# Patient Record
Sex: Female | Born: 1970 | Race: White | Hispanic: No | Smoking: Current every day smoker
Health system: Southern US, Community
[De-identification: ages and names within clinical notes are randomized; demographics above are authoritative.]

## PROBLEM LIST (undated history)

## (undated) DIAGNOSIS — F329 Major depressive disorder, single episode, unspecified: Secondary | ICD-10-CM

## (undated) DIAGNOSIS — F419 Anxiety disorder, unspecified: Secondary | ICD-10-CM

## (undated) DIAGNOSIS — E079 Disorder of thyroid, unspecified: Secondary | ICD-10-CM

## (undated) DIAGNOSIS — M5030 Other cervical disc degeneration, unspecified cervical region: Secondary | ICD-10-CM

## (undated) DIAGNOSIS — A1801 Tuberculosis of spine: Secondary | ICD-10-CM

## (undated) DIAGNOSIS — I4891 Unspecified atrial fibrillation: Secondary | ICD-10-CM

## (undated) DIAGNOSIS — F32A Depression, unspecified: Secondary | ICD-10-CM

## (undated) HISTORY — DX: Disorder of thyroid, unspecified: E07.9

## (undated) HISTORY — DX: Major depressive disorder, single episode, unspecified: F32.9

## (undated) HISTORY — DX: Unspecified atrial fibrillation: I48.91

## (undated) HISTORY — PX: TONSILLECTOMY: SHX5217

## (undated) HISTORY — PX: KNEE SURGERY: SHX244

## (undated) HISTORY — DX: Depression, unspecified: F32.A

## (undated) HISTORY — DX: Tuberculosis of spine: A18.01

## (undated) HISTORY — DX: Other cervical disc degeneration, unspecified cervical region: M50.30

## (undated) HISTORY — PX: BREAST LUMPECTOMY: SHX2

## (undated) HISTORY — DX: Anxiety disorder, unspecified: F41.9

---

## 1998-03-24 ENCOUNTER — Other Ambulatory Visit: Admission: RE | Admit: 1998-03-24 | Discharge: 1998-03-24 | Payer: Self-pay | Admitting: Family Medicine

## 2000-06-28 ENCOUNTER — Encounter: Admission: RE | Admit: 2000-06-28 | Discharge: 2000-08-11 | Payer: Self-pay | Admitting: Orthopedic Surgery

## 2000-08-24 ENCOUNTER — Ambulatory Visit (HOSPITAL_BASED_OUTPATIENT_CLINIC_OR_DEPARTMENT_OTHER): Admission: RE | Admit: 2000-08-24 | Discharge: 2000-08-24 | Payer: Self-pay | Admitting: Orthopedic Surgery

## 2004-03-12 ENCOUNTER — Other Ambulatory Visit: Admission: RE | Admit: 2004-03-12 | Discharge: 2004-03-12 | Payer: Self-pay | Admitting: Family Medicine

## 2005-07-05 ENCOUNTER — Other Ambulatory Visit: Admission: RE | Admit: 2005-07-05 | Discharge: 2005-07-05 | Payer: Self-pay | Admitting: Family Medicine

## 2006-03-31 ENCOUNTER — Encounter: Admission: RE | Admit: 2006-03-31 | Discharge: 2006-03-31 | Payer: Self-pay | Admitting: Family Medicine

## 2007-05-30 ENCOUNTER — Encounter: Admission: RE | Admit: 2007-05-30 | Discharge: 2007-05-30 | Payer: Self-pay | Admitting: Family Medicine

## 2011-03-05 NOTE — Op Note (Signed)
[place]. [facility]  Patient:    GEORGETTA, CRAFTON                       MRN: [id number] Proc. Date: 08/24/00 Adm. Date:  [id number] Attending:  Alinda Deem                           Operative Report  PREOPERATIVE DIAGNOSIS:  Right femoral neck fracture.  POSTOPERATIVE DIAGNOSIS:  Right femoral neck fracture.  OPERATION PERFORMED:  Right hip hemiarthroplasty using Osteonics #7 ODC stem, 11 mm tip, #3 cement restrictor, 47 mm NK+0 monopolar ball.  A double batch of Simplex methyl methacrylate cement with 1500 mg of Zinacef.  SURGEON:  Alinda Deem, M.D.  ASSISTANT:  Dorthula Matas, P.A.-C.  ANESTHESIA:  General endotracheal.  ESTIMATED BLOOD LOSS:  400 cc.  FLUID REPLACEMENT:  1200 cc of crystalloid.  DRAINS:  None.  TOURNIQUET TIME:  None.  INDICATIONS FOR PROCEDURE:  The patient is a DD:  08/24/00 TD:  08/25/00 Job: [id number]

## 2011-03-05 NOTE — Op Note (Signed)
[name]. [facility]  Patient:    Michelle Ortega, BOOZ                       MRN: [id number] Proc. Date: 08/24/00 Adm. Date:  [id number] Attending:  Alinda Deem                           Operative Report  PREOPERATIVE DIAGNOSIS:  Left knee medial meniscal tear.  POSTOPERATIVE DIAGNOSIS:  Left knee plica syndrome.  OPERATION:  Left knee excision of inflamed plica.  SURGEON:  Alinda Deem, M.D.  FIRST ASSISTANT:  Dorthula Matas, P.A.-C.  ANESTHESIA:  General LMA  ESTIMATED BLOOD LOSS:  Minimal  FLUID REPLACEMENT:  100 cc of crystalloid.  DRAINS PLACED: None.  TOURNIQUET TIME:  None.  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  Because of persistent medial joint line pain, she is taken to the operating room for arthroscopic evaluation of her left knee with presumed diagnosis medial meniscal tear.  DESCRIPTION OF PROCEDURE:  The patient identified by arm band and taken to the operating room at [facility].  Appropriate anesthetic monitors were attached and general LMA anesthesia induced with the patient in the supine position.  At this point, the lateral post was applied to the table and all four extremities prepped and draped in the usual sterile fashion from the ankle to the mid thigh.  Standard inferomedial and inferolateral parapatellar portals were then made allowing introduction of the arthroscope through the inferolateral portal, outflow through the inferomedial portal.  The suprapatellar pouch, patella and trochlear were in excellent condition.  The lateral compartment was in excellent condition as was the medial compartment, ACL and PCL.  There was an inflamed plica noted and this  was resected with a 3.5 mm gator sucker shaver back to a stable margin.  The gutters were cleared. The scope was taken medial and lateral to the PCL clearing the posterior compartment as well.  At this point, the arthroscopic instruments were removed.  A dressing of Xeroform followed by a dressing sponge, Webril and Ace wrap applied.  The patient was awakened and taken to the recovery room without difficulty. DD:  08/24/00 TD:  08/25/00 Job: [id number]

## 2013-06-01 ENCOUNTER — Encounter: Payer: Self-pay | Admitting: Family Medicine

## 2013-06-01 ENCOUNTER — Ambulatory Visit (INDEPENDENT_AMBULATORY_CARE_PROVIDER_SITE_OTHER): Payer: 59 | Admitting: Family Medicine

## 2013-06-01 VITALS — BP 109/70 | HR 78 | Temp 97.1°F | Ht 66.0 in | Wt 134.0 lb

## 2013-06-01 DIAGNOSIS — R5383 Other fatigue: Secondary | ICD-10-CM

## 2013-06-01 DIAGNOSIS — R55 Syncope and collapse: Secondary | ICD-10-CM

## 2013-06-01 DIAGNOSIS — R5381 Other malaise: Secondary | ICD-10-CM

## 2013-06-01 DIAGNOSIS — H539 Unspecified visual disturbance: Secondary | ICD-10-CM

## 2013-06-01 DIAGNOSIS — R42 Dizziness and giddiness: Secondary | ICD-10-CM

## 2013-06-01 DIAGNOSIS — R51 Headache: Secondary | ICD-10-CM

## 2013-06-01 LAB — POCT UA - MICROSCOPIC ONLY
Bacteria, U Microscopic: NEGATIVE
Casts, Ur, LPF, POC: NEGATIVE
Crystals, Ur, HPF, POC: NEGATIVE
WBC, Ur, HPF, POC: NEGATIVE
Yeast, UA: NEGATIVE

## 2013-06-01 LAB — POCT CBC
Granulocyte percent: 71 %G (ref 37–80)
HCT, POC: 39.4 % (ref 37.7–47.9)
Hemoglobin: 13.6 g/dL (ref 12.2–16.2)
Lymph, poc: 2.3 (ref 0.6–3.4)
MCH, POC: 32.1 pg — AB (ref 27–31.2)
MCHC: 34.6 g/dL (ref 31.8–35.4)
MCV: 92.7 fL (ref 80–97)
MPV: 7.6 fL (ref 0–99.8)
POC Granulocyte: 6 (ref 2–6.9)
POC LYMPH PERCENT: 26.8 %L (ref 10–50)
Platelet Count, POC: 251 10*3/uL (ref 142–424)
RBC: 4.3 M/uL (ref 4.04–5.48)
RDW, POC: 12.5 %
WBC: 8.5 10*3/uL (ref 4.6–10.2)

## 2013-06-01 LAB — POCT URINALYSIS DIPSTICK
Bilirubin, UA: NEGATIVE
Blood, UA: NEGATIVE
Glucose, UA: NEGATIVE
Ketones, UA: NEGATIVE
Leukocytes, UA: NEGATIVE
Nitrite, UA: NEGATIVE
Protein, UA: NEGATIVE
Spec Grav, UA: 1.02
Urobilinogen, UA: NEGATIVE
pH, UA: 6.5

## 2013-06-01 MED ORDER — MECLIZINE HCL 25 MG PO TABS: 25.0000 mg | ORAL_TABLET | Freq: Three times a day (TID) | ORAL | Status: DC | PRN

## 2013-06-01 NOTE — Progress Notes (Signed)
Subjective:    Patient ID: Michelle Ortega, female    DOB: [birth year], 42 y.o.   MRN: [id number]  HPI This 42 y.o. female presents for with multiple medical complaints of dizzyness and feeling like she is going to pass out. She states over last 2 weeks she has been having pre-syncopal episodes.  She is having dizziness.  She is having headaches and blurred vision.  She has hx of migraine headaches.  She has been feeling tired and fatigued. Her last episode was 2 days ago and she went to get up at [facility] and she felt weak nauseated And then went to the bathroom and vomited. She has been having these symptoms on and off for 2 weeks. She has been having headaches and blurred vision.  She is teary in the interview.   Review of Systems    No chest pain, SOB, HA, dizziness, vision change, N/V, diarrhea, constipation, dysuria, urinary urgency or frequency, myalgias, arthralgias or rash.  Objective:   Physical Exam Vital signs noted  Well developed well nourished female.  HEENT - Head atraumatic Normocephalic                Eyes - PERRLA, Conjuctiva - clear Sclera- Clear EOMI                Ears - EAC's Wnl TM's Wnl Gross Hearing WNL                Nose - Nares patent                 Throat - oropharanx wnl Respiratory - Lungs CTA bilateral Cardiac - RRR S1 and S2 without murmur GI - Abdomen soft Nontender and bowel sounds active x 4 Extremities - No edema. Neuro - Grossly intact.   Results for orders placed in visit on 06/01/13  CMP14+EGFR      Result Value Range   Glucose 80  65 - 99 mg/dL   BUN 8  6 - 24 mg/dL   Creatinine, Ser 4.09 (*) 0.57 - 1.00 mg/dL   GFR calc non Af Amer 119  >59 mL/min/1.73   GFR calc Af Amer 137  >59 mL/min/1.73   BUN/Creatinine Ratio 15  9 - 23   Sodium 139  134 - 144 mmol/L   Potassium 4.2  3.5 - 5.2 mmol/L   Chloride 101  97 - 108 mmol/L   CO2 25  18 - 29 mmol/L   Calcium 9.3  8.7 - 10.2 mg/dL   Total Protein 7.0  6.0 - 8.5 g/dL   Albumin 4.4   3.5 - 5.5 g/dL   Globulin, Total 2.6  1.5 - 4.5 g/dL   Albumin/Globulin Ratio 1.7  1.1 - 2.5   Total Bilirubin 0.4  0.0 - 1.2 mg/dL   Alkaline Phosphatase 69  39 - 117 IU/L   AST 15  0 - 40 IU/L   ALT 7  0 - 32 IU/L  TSH      Result Value Range   TSH 8.470 (*) 0.450 - 4.500 uIU/mL   COMMENT Comment    POCT CBC      Result Value Range   WBC 8.5  4.6 - 10.2 K/uL   Lymph, poc 2.3  0.6 - 3.4   POC LYMPH PERCENT 26.8  10 - 50 %L   POC Granulocyte 6.0  2 - 6.9   Granulocyte percent 71.0  37 - 80 %G   RBC 4.3  4.04 - 5.48  M/uL   Hemoglobin 13.6  12.2 - 16.2 g/dL   HCT, POC 78.2  95.6 - 47.9 %   MCV 92.7  80 - 97 fL   MCH, POC 32.1 (*) 27 - 31.2 pg   MCHC 34.6  31.8 - 35.4 g/dL   RDW, POC 21.3     Platelet Count, POC 251.0  142 - 424 K/uL   MPV 7.6  0 - 99.8 fL  POCT UA - MICROSCOPIC ONLY      Result Value Range   WBC, Ur, HPF, POC neg     RBC, urine, microscopic 1-2     Bacteria, U Microscopic neg     Mucus, UA trace     Epithelial cells, urine per micros occ     Crystals, Ur, HPF, POC neg     Casts, Ur, LPF, POC neg     Yeast, UA neg    POCT URINALYSIS DIPSTICK      Result Value Range   Color, UA yellow     Clarity, UA clear     Glucose, UA neg     Bilirubin, UA neg     Ketones, UA neg     Spec Grav, UA 1.020     Blood, UA neg     pH, UA 6.5     Protein, UA neg     Urobilinogen, UA negative     Nitrite, UA neg     Leukocytes, UA Negative        Assessment & Plan:  Pre-syncope - Plan: Ambulatory referral to Cardiology, POCT UA - Microscopic Only, CANCELED: POCT UA - Microalbumin  Headache(784.0) - Plan: CT Head W Contrast  Visual disturbance - Follow up with optometrist  Vertigo - Plan: meclizine (ANTIVERT) 25 MG tablet, POCT urinalysis dipstick  Other malaise and fatigue - Plan: POCT CBC, CMP14+EGFR, TSH, POCT urinalysis dipstick  Follow up in one week.

## 2013-06-02 LAB — CMP14+EGFR
ALT: 7 IU/L (ref 0–32)
AST: 15 IU/L (ref 0–40)
Albumin/Globulin Ratio: 1.7 (ref 1.1–2.5)
Albumin: 4.4 g/dL (ref 3.5–5.5)
Alkaline Phosphatase: 69 IU/L (ref 39–117)
BUN/Creatinine Ratio: 15 (ref 9–23)
BUN: 8 mg/dL (ref 6–24)
CO2: 25 mmol/L (ref 18–29)
Calcium: 9.3 mg/dL (ref 8.7–10.2)
Chloride: 101 mmol/L (ref 97–108)
Creatinine, Ser: 0.52 mg/dL — ABNORMAL LOW (ref 0.57–1.00)
GFR calc Af Amer: 137 mL/min/{1.73_m2} (ref >59)
GFR calc non Af Amer: 119 mL/min/{1.73_m2} (ref >59)
Globulin, Total: 2.6 g/dL (ref 1.5–4.5)
Glucose: 80 mg/dL (ref 65–99)
Potassium: 4.2 mmol/L (ref 3.5–5.2)
Sodium: 139 mmol/L (ref 134–144)
Total Bilirubin: 0.4 mg/dL (ref 0.0–1.2)
Total Protein: 7 g/dL (ref 6.0–8.5)

## 2013-06-02 LAB — TSH: TSH: 8.47 u[IU]/mL — ABNORMAL HIGH (ref 0.450–4.500)

## 2013-06-04 ENCOUNTER — Other Ambulatory Visit: Payer: Self-pay | Admitting: Family Medicine

## 2013-06-04 DIAGNOSIS — E039 Hypothyroidism, unspecified: Secondary | ICD-10-CM

## 2013-06-04 MED ORDER — LEVOTHYROXINE SODIUM 25 MCG PO TABS: 25.0000 ug | ORAL_TABLET | Freq: Every day | ORAL | Status: DC

## 2013-06-04 NOTE — Patient Instructions (Signed)

## 2013-06-05 ENCOUNTER — Other Ambulatory Visit: Payer: Self-pay

## 2013-06-05 DIAGNOSIS — R51 Headache: Secondary | ICD-10-CM

## 2013-06-06 ENCOUNTER — Ambulatory Visit (INDEPENDENT_AMBULATORY_CARE_PROVIDER_SITE_OTHER): Payer: 59 | Admitting: Family Medicine

## 2013-06-06 ENCOUNTER — Encounter: Payer: Self-pay | Admitting: Family Medicine

## 2013-06-06 VITALS — BP 111/74 | HR 87 | Temp 98.1°F | Wt 135.6 lb

## 2013-06-06 DIAGNOSIS — E039 Hypothyroidism, unspecified: Secondary | ICD-10-CM

## 2013-06-06 DIAGNOSIS — R519 Headache, unspecified: Secondary | ICD-10-CM | POA: Insufficient documentation

## 2013-06-06 DIAGNOSIS — R55 Syncope and collapse: Secondary | ICD-10-CM | POA: Insufficient documentation

## 2013-06-06 DIAGNOSIS — R42 Dizziness and giddiness: Secondary | ICD-10-CM | POA: Insufficient documentation

## 2013-06-06 DIAGNOSIS — R51 Headache: Secondary | ICD-10-CM

## 2013-06-06 NOTE — Progress Notes (Signed)
  Subjective:    Patient ID: Michelle Ortega, female    DOB: [birth year], 42 y.o.   MRN: [id number]  HPI This 42 y.o. female presents for evaluation of dizziness. She has been having dizziness Which has improved since taking the antivert.  She states she is still awaiting her cardiology Referral for her pre-syncopal episodes.  She has not had any sx's of recent.  Her recent labs Show hypothyroid state and she has been rx'd levothyroxine which she has just started. She has been having severe headaches and a CT scan of the head has been ordered.   Review of Systems C/o headaches and dizziness. No chest pain, SOB, HA, dizziness, vision change, N/V, diarrhea, constipation, dysuria, urinary urgency or frequency, myalgias, arthralgias or rash.     Objective:   Physical Exam Vital signs noted  Well developed well nourished female.  HEENT - Head atraumatic Normocephalic                Eyes - PERRLA, Conjuctiva - clear Sclera- Clear EOMI                Ears - EAC's Wnl TM's Wnl Gross Hearing WNL                Nose - Nares patent                 Throat - oropharanx wnl Respiratory - Lungs CTA bilateral Cardiac - RRR S1 and S2 without murmur        Assessment & Plan:  Unspecified hypothyroidism - Continue levothyroxine po qd and follow up for TSH in one month.  Headache(784.0) - Await CT of the head  Pre-syncope - Follow up with Cardiology  Vertigo - Improving and continue meclizine

## 2013-06-06 NOTE — Patient Instructions (Signed)

## 2013-06-08 ENCOUNTER — Ambulatory Visit (HOSPITAL_COMMUNITY): Payer: 59

## 2013-06-08 ENCOUNTER — Telehealth: Payer: Self-pay | Admitting: Family Medicine

## 2013-06-11 ENCOUNTER — Other Ambulatory Visit: Payer: Self-pay | Admitting: Family Medicine

## 2013-06-11 ENCOUNTER — Ambulatory Visit (HOSPITAL_COMMUNITY)
Admission: RE | Admit: 2013-06-11 | Discharge: 2013-06-11 | Disposition: A | Discharge status: Home / Self Care | Payer: 59 | Source: Ambulatory Visit | Attending: Family Medicine | Admitting: Family Medicine

## 2013-06-11 DIAGNOSIS — R51 Headache: Secondary | ICD-10-CM

## 2013-06-12 ENCOUNTER — Telehealth: Payer: Self-pay | Admitting: Family Medicine

## 2013-07-09 ENCOUNTER — Other Ambulatory Visit: Payer: 59

## 2014-12-20 ENCOUNTER — Other Ambulatory Visit: Payer: Self-pay | Admitting: Physician Assistant

## 2014-12-20 ENCOUNTER — Encounter: Payer: Self-pay | Admitting: Physician Assistant

## 2014-12-20 ENCOUNTER — Telehealth: Payer: Self-pay | Admitting: Physician Assistant

## 2014-12-20 ENCOUNTER — Ambulatory Visit (INDEPENDENT_AMBULATORY_CARE_PROVIDER_SITE_OTHER): Payer: 59

## 2014-12-20 ENCOUNTER — Ambulatory Visit (INDEPENDENT_AMBULATORY_CARE_PROVIDER_SITE_OTHER): Payer: 59 | Admitting: Physician Assistant

## 2014-12-20 VITALS — BP 110/76 | HR 86 | Temp 97.5°F | Ht 66.0 in | Wt 139.2 lb

## 2014-12-20 DIAGNOSIS — M79641 Pain in right hand: Secondary | ICD-10-CM

## 2014-12-20 DIAGNOSIS — M654 Radial styloid tenosynovitis [de Quervain]: Secondary | ICD-10-CM | POA: Diagnosis not present

## 2014-12-20 MED ORDER — MELOXICAM 7.5 MG PO TABS: 7.5000 mg | ORAL_TABLET | Freq: Every day | ORAL | Status: DC

## 2014-12-20 NOTE — Patient Instructions (Signed)
De Quervain's Tenosynovitis De Quervain's tenosynovitis involves inflammation of one or two tendon linings (sheaths) or strain of one or two tendons to the thumb: extensor pollicis brevis (EPB), or abductor pollicis longus (APL). This causes pain on the side of the wrist and base of the thumb. Tendon sheaths secrete a fluid that lubricates the tendon, allowing the tendon to move smoothly. When the sheath becomes inflamed, the tendon cannot move freely in the sheath. Both the EPB and APL tendons are important for proper use of the hand. The EPB tendon is important for straightening the thumb. The APL tendon is important for moving the thumb away from the index finger (abducting). The two tendons pass through a small tube (canal) in the wrist, near the base of the thumb. When the tendons become inflamed, pain is usually felt in this area. SYMPTOMS   Pain, tenderness, swelling, warmth, or redness over the base of the thumb and thumb side of the wrist.  Pain that gets worse when straightening the thumb.  Pain that gets worse when moving the thumb away from the index finger, against resistance.  Pain with pinching or gripping.  Locking or catching of the thumb.  Limited motion of the thumb.  Crackling sound (crepitation) when the tendon or thumb is moved or touched.  Fluid-filled cyst in the area of the base of the thumb. CAUSES   Tenosynovitis is often linked with overuse of the wrist.  Tenosynovitis may be caused by repeated injury to the thumb muscle and tendon units, and with repeated motions of the hand and wrist, due to friction of the tendon within the lining (sheath).  Tenosynovitis may also be due to a sudden increase in activity or change in activity. RISK INCREASES WITH:  Sports that involve repeated hand and wrist motions (golf, bowling, tennis, squash, racquetball).  Heavy labor.  Poor physical wrist strength and flexibility.  Failure to warm up properly before practice or  play.  Female gender.  New mothers who hold their baby's head for long periods or lift infants with thumbs in the infant's armpit (axilla). PREVENTION  Warm up and stretch properly before practice or competition.  Allow enough time for rest and recovery between practices and competition.  Maintain appropriate conditioning:  Cardiovascular fitness.  Forearm, wrist, and hand flexibility.  Muscle strength and endurance.  Use proper exercise technique. PROGNOSIS  This condition is usually curable within 6 weeks, if treated properly with non-surgical treatment and resting of the affected area.  RELATED COMPLICATIONS   Longer healing time if not properly treated or if not given enough time to heal.  Chronic inflammation, causing recurring symptoms of tenosynovitis. Permanent pain or restriction of movement.  Risks of surgery: infection, bleeding, injury to nerves (numbness of the thumb), continued pain, incomplete release of the tendon sheath, recurring symptoms, cutting of the tendons, tendons sliding out of position, weakness of the thumb, thumb stiffness. TREATMENT  First, treatment involves the use of medicine and ice, to reduce pain and inflammation. Patients are encouraged to stop or modify activities that aggravate the injury. Stretching and strengthening exercises may be advised. Exercises may be completed at home or with a therapist. You may be fitted with a brace or splint, to limit motion and allow the injury to heal. Your caregiver may also choose to give you a corticosteroid injection, to reduce the pain and inflammation. If non-surgical treatment is not successful, surgery may be needed. Most tenosynovitis surgeries are done as outpatient procedures (you go home the   same day). Surgery may involve local, regional (whole arm), or general anesthesia.  MEDICATION   If pain medicine is needed, nonsteroidal anti-inflammatory medicines (aspirin and ibuprofen), or other minor pain  relievers (acetaminophen), are often advised.  Do not take pain medicine for 7 days before surgery.  Prescription pain relievers are often prescribed only after surgery. Use only as directed and only as much as you need.  Corticosteroid injections may be given if your caregiver thinks they are needed. There is a limited number of times these injections may be given. COLD THERAPY   Cold treatment (icing) should be applied for 10 to 15 minutes every 2 to 3 hours for inflammation and pain, and immediately after activity that aggravates your symptoms. Use ice packs or an ice massage. SEEK MEDICAL CARE IF:   Symptoms get worse or do not improve in 2 to 4 weeks, despite treatment.  You experience pain, numbness, or coldness in the hand.  Blue, gray, or dark color appears in the fingernails.  Any of the following occur after surgery: increased pain, swelling, redness, drainage of fluids, bleeding in the affected area, or signs of infection.  New, unexplained symptoms develop. (Drugs used in treatment may produce side effects.) Document Released: 10/04/2005 Document Revised: 12/27/2011 Document Reviewed: 01/16/2009 ExitCare Patient Information 2015 [employer]. This information is not intended to replace advice given to you by your health care provider. Make sure you discuss any questions you have with your health care provider.   

## 2014-12-23 NOTE — Progress Notes (Signed)
Subjective:     Patient ID: Michelle Ortega, female   DOB: 05/14/1971, 44 y.o.   MRN: [id number]  HPI 44 y/o RH dominant female presents with intermittent right wrist pain x 1 month. She works at [employer] and frequently [employer] boxes and performs repetitive motions. Has tried aleve and goodys with no relief. No known injury   Review of Systems  Musculoskeletal: Positive for arthralgias (right wrist/thumb. Worse with movement). Negative for joint swelling.  Skin: Negative.        Objective:   Physical Exam  Constitutional: She is oriented to person, place, and time.  Musculoskeletal: She exhibits no edema.  Pain worse with resisted supination and forward wrist flexion  Neurological: She is alert and oriented to person, place, and time.       Assessment:     Berline Lopese Quervains Tenosynovitis - xray negative for fracture or dislocation     Plan:     Rx Mobic 7.5 mg q day. May increase to 2 tablets daily on week 2 if no improvement. RICE and wrist brace 24 hrs/day . No lifting F/U in 3-4 weeks for reassessment.

## 2015-01-20 ENCOUNTER — Encounter: Payer: Self-pay | Admitting: Family

## 2015-01-20 ENCOUNTER — Ambulatory Visit (INDEPENDENT_AMBULATORY_CARE_PROVIDER_SITE_OTHER): Payer: 59 | Admitting: Family

## 2015-01-20 ENCOUNTER — Other Ambulatory Visit: Payer: Self-pay | Admitting: Family

## 2015-01-20 VITALS — BP 109/79 | HR 102 | Temp 98.8°F | Ht 66.0 in | Wt 139.8 lb

## 2015-01-20 DIAGNOSIS — Z0000 Encounter for general adult medical examination without abnormal findings: Secondary | ICD-10-CM | POA: Diagnosis not present

## 2015-01-20 DIAGNOSIS — E039 Hypothyroidism, unspecified: Secondary | ICD-10-CM | POA: Diagnosis not present

## 2015-01-20 DIAGNOSIS — Z01419 Encounter for gynecological examination (general) (routine) without abnormal findings: Secondary | ICD-10-CM | POA: Diagnosis not present

## 2015-01-20 DIAGNOSIS — F329 Major depressive disorder, single episode, unspecified: Secondary | ICD-10-CM

## 2015-01-20 DIAGNOSIS — S63501D Unspecified sprain of right wrist, subsequent encounter: Secondary | ICD-10-CM

## 2015-01-20 DIAGNOSIS — F32A Depression, unspecified: Secondary | ICD-10-CM

## 2015-01-20 MED ORDER — CITALOPRAM HYDROBROMIDE 20 MG PO TABS: 20.0000 mg | ORAL_TABLET | Freq: Every day | ORAL | Status: DC

## 2015-01-20 MED ORDER — MELOXICAM 15 MG PO TABS: 15.0000 mg | ORAL_TABLET | Freq: Every day | ORAL | Status: DC

## 2015-01-20 MED ORDER — LEVOTHYROXINE SODIUM 25 MCG PO TABS: 25.0000 ug | ORAL_TABLET | Freq: Every day | ORAL | Status: DC

## 2015-01-20 NOTE — Progress Notes (Signed)
Subjective:    Patient ID: Michelle Ortega, female    DOB: 09/24/1971, 44 y.o.   MRN: [id number]  Pt presents to the office today for CPE with pap.  Gynecologic Exam Pertinent negatives include no diarrhea, headaches or joint swelling.  Wrist Pain  The pain is present in the right wrist. This is a new problem. The current episode started more than 1 month ago. There has been no history of extremity trauma. The problem occurs intermittently. The problem has been gradually improving. The pain is at a severity of 3/10. The pain is mild. Pertinent negatives include no inability to bear weight, itching, joint swelling, numbness, stiffness or tingling. She has tried movement and NSAIDS for the symptoms. The treatment provided significant relief. There is no history of diabetes.  Thyroid Problem Presents for follow-up visit. Symptoms include depressed mood and hair loss. Patient reports no anxiety, diaphoresis, diarrhea, dry skin, palpitations or visual change. The symptoms have been worsening. Past treatments include levothyroxine. The treatment provided significant relief. There is no history of diabetes.      Review of Systems  Constitutional: Negative.  Negative for diaphoresis.  HENT: Negative.   Eyes: Negative.   Respiratory: Negative.  Negative for shortness of breath.   Cardiovascular: Negative.  Negative for palpitations.  Gastrointestinal: Negative.  Negative for diarrhea.  Endocrine: Negative.   Genitourinary: Negative.   Musculoskeletal: Negative.  Negative for stiffness.  Skin: Negative for itching.  Neurological: Negative.  Negative for tingling, numbness and headaches.  Hematological: Negative.   Psychiatric/Behavioral: Negative.  The patient is not nervous/anxious.   All other systems reviewed and are negative.      Objective:   Physical Exam  Constitutional: She is oriented to person, place, and time. She appears well-developed and well-nourished. No distress.  HENT:    Head: Normocephalic and atraumatic.  Right Ear: External ear normal.  Left Ear: External ear normal.  Nose: Nose normal.  Mouth/Throat: Oropharynx is clear and moist.  Eyes: Pupils are equal, round, and reactive to light.  Neck: Normal range of motion. Neck supple. No thyromegaly present.  Cardiovascular: Normal rate, regular rhythm, normal heart sounds and intact distal pulses.   No murmur heard. Pulmonary/Chest: Effort normal and breath sounds normal. No respiratory distress. She has no wheezes. Right breast exhibits no inverted nipple, no mass, no nipple discharge, no skin change and no tenderness. Left breast exhibits no inverted nipple, no mass, no nipple discharge, no skin change and no tenderness. Breasts are symmetrical.  Abdominal: Soft. Bowel sounds are normal. She exhibits no distension. There is no tenderness.  Genitourinary: Vagina normal.  Bimanual exam- no adnexal masses or tenderness, ovaries nonpalpable   Cervix parous and pink- No discharge   Musculoskeletal: Normal range of motion. She exhibits no edema or tenderness.  Neurological: She is alert and oriented to person, place, and time. She has normal reflexes. No cranial nerve deficit.  Skin: Skin is warm and dry.  Psychiatric: She has a normal mood and affect. Her behavior is normal. Judgment and thought content normal.  Vitals reviewed.   BP 109/79 mmHg  Pulse 102  Temp(Src) 98.8 F (37.1 C) (Oral)  Ht _0  (1.676 m)  Wt 139 lb 12.8 oz (63.413 kg)  BMI 22.58 kg/m2       Assessment & Plan:  1. Hypothyroidism, unspecified hypothyroidism type -Pt has been out of medication for a "few weeks" and has not taken medicaiton  Thyroid Panel With TSH - levothyroxine (LEVOTHROID) 25  MCG tablet; Take 1 tablet (25 mcg total) by mouth daily before breakfast.  Dispense: 30 tablet; Refill: 3  2. Annual physical exam - CMP14+EGFR - Lipid panel - Thyroid Panel With TSH - Vit D  25 hydroxy (rtn osteoporosis  monitoring) - POCT CBC - Pap IG w/ reflex to HPV when ASC-U  3. Encounter for routine gynecological examination - Pap IG w/ reflex to HPV when ASC-U  4. Wrist sprain, right, subsequent encounter -Rest -No other NSAID's  meloxicam (MOBIC) 15 MG tablet; Take 1 tablet (15 mg total) by mouth daily.  Dispense: 30 tablet; Refill: 0  5. Depression -Pt started on celexa today  citalopram (CELEXA) 20 MG tablet; Take 1 tablet (20 mg total) by mouth daily.  Dispense: 90 tablet; Refill: 3   Continue all meds Labs pending Health Maintenance reviewed-Currently out of TDAP- Pt will get TDAP in 2 weeks on recheck Diet and exercise encouraged RTO 2 weeks to recheck depression  Evelina Dun, FNP

## 2015-01-20 NOTE — Addendum Note (Signed)
Addended by: Prescott GumLAND, Zay Yeargan M on: 01/20/2015 04:18 PM   Modules accepted: Orders

## 2015-01-20 NOTE — Patient Instructions (Signed)

## 2015-01-20 NOTE — Addendum Note (Signed)
Addended by: Prescott GumLAND, Jerrion Tabbert M on: 01/20/2015 04:20 PM   Modules accepted: Kipp BroodSmartSet

## 2015-01-21 LAB — CBC WITH DIFFERENTIAL/PLATELET
BASOS ABS: 0 10*3/uL (ref 0.0–0.2)
BASOS: 0 %
EOS ABS: 0.1 10*3/uL (ref 0.0–0.4)
EOS: 1 %
HCT: 42 % (ref 34.0–46.6)
HEMOGLOBIN: 14.4 g/dL (ref 11.1–15.9)
Immature Grans (Abs): 0 10*3/uL (ref 0.0–0.1)
Immature Granulocytes: 0 %
LYMPHS: 29 %
Lymphocytes Absolute: 2.8 10*3/uL (ref 0.7–3.1)
MCH: 31.6 pg (ref 26.6–33.0)
MCHC: 34.3 g/dL (ref 31.5–35.7)
MCV: 92 fL (ref 79–97)
MONOCYTES: 8 %
Monocytes Absolute: 0.8 10*3/uL (ref 0.1–0.9)
NEUTROS PCT: 62 %
Neutrophils Absolute: 6.1 10*3/uL (ref 1.4–7.0)
Platelets: 278 10*3/uL (ref 150–379)
RBC: 4.55 x10E6/uL (ref 3.77–5.28)
RDW: 13.2 % (ref 12.3–15.4)
WBC: 9.8 10*3/uL (ref 3.4–10.8)

## 2015-01-21 LAB — VITAMIN D 25 HYDROXY (VIT D DEFICIENCY, FRACTURES): Vit D, 25-Hydroxy: 8.8 ng/mL — ABNORMAL LOW (ref 30.0–100.0)

## 2015-01-21 LAB — CMP14+EGFR
A/G RATIO: 1.7 (ref 1.1–2.5)
ALT: 8 IU/L (ref 0–32)
AST: 11 IU/L (ref 0–40)
Albumin: 4.5 g/dL (ref 3.5–5.5)
Alkaline Phosphatase: 72 IU/L (ref 39–117)
BUN/Creatinine Ratio: 14 (ref 9–23)
BUN: 8 mg/dL (ref 6–24)
Bilirubin Total: 0.3 mg/dL (ref 0.0–1.2)
CALCIUM: 8.9 mg/dL (ref 8.7–10.2)
CHLORIDE: 102 mmol/L (ref 97–108)
CO2: 20 mmol/L (ref 18–29)
Creatinine, Ser: 0.57 mg/dL (ref 0.57–1.00)
GFR calc Af Amer: 131 mL/min/{1.73_m2} (ref >59)
GFR, EST NON AFRICAN AMERICAN: 114 mL/min/{1.73_m2} (ref >59)
GLUCOSE: 90 mg/dL (ref 65–99)
Globulin, Total: 2.7 g/dL (ref 1.5–4.5)
POTASSIUM: 3.8 mmol/L (ref 3.5–5.2)
Sodium: 136 mmol/L (ref 134–144)
TOTAL PROTEIN: 7.2 g/dL (ref 6.0–8.5)

## 2015-01-21 LAB — PAP IG W/ RFLX HPV ASCU: PAP Smear Comment: 0

## 2015-01-21 LAB — THYROID PANEL WITH TSH
FREE THYROXINE INDEX: 1.5 (ref 1.2–4.9)
T3 Uptake Ratio: 23 % — ABNORMAL LOW (ref 24–39)
T4 TOTAL: 6.6 ug/dL (ref 4.5–12.0)
TSH: 10.29 u[IU]/mL — ABNORMAL HIGH (ref 0.450–4.500)

## 2015-01-21 LAB — LIPID PANEL
CHOL/HDL RATIO: 2.6 ratio (ref 0.0–4.4)
CHOLESTEROL TOTAL: 183 mg/dL (ref 100–199)
HDL: 70 mg/dL (ref >39)
LDL Calculated: 101 mg/dL — ABNORMAL HIGH (ref 0–99)
TRIGLYCERIDES: 60 mg/dL (ref 0–149)
VLDL Cholesterol Cal: 12 mg/dL (ref 5–40)

## 2015-01-22 ENCOUNTER — Other Ambulatory Visit: Payer: Self-pay | Admitting: Family

## 2015-01-22 DIAGNOSIS — E559 Vitamin D deficiency, unspecified: Secondary | ICD-10-CM

## 2015-01-22 MED ORDER — VITAMIN D (ERGOCALCIFEROL) 1.25 MG (50000 UNIT) PO CAPS
50000.0000 [IU] | ORAL_CAPSULE | ORAL | Status: DC
Start: 2015-01-22 — End: 2016-09-27

## 2015-01-22 MED ORDER — LEVOTHYROXINE SODIUM 75 MCG PO TABS: 75.0000 ug | ORAL_TABLET | Freq: Every day | ORAL | Status: DC

## 2015-01-23 ENCOUNTER — Encounter: Payer: Self-pay | Admitting: *Deleted

## 2015-02-07 ENCOUNTER — Ambulatory Visit (INDEPENDENT_AMBULATORY_CARE_PROVIDER_SITE_OTHER): Payer: 59 | Admitting: Family

## 2015-02-07 ENCOUNTER — Encounter: Payer: Self-pay | Admitting: Family

## 2015-02-07 VITALS — BP 119/79 | HR 82 | Temp 98.0°F | Ht 66.0 in | Wt 140.6 lb

## 2015-02-07 DIAGNOSIS — F32A Depression, unspecified: Secondary | ICD-10-CM

## 2015-02-07 DIAGNOSIS — F329 Major depressive disorder, single episode, unspecified: Secondary | ICD-10-CM | POA: Diagnosis not present

## 2015-02-07 DIAGNOSIS — Z23 Encounter for immunization: Secondary | ICD-10-CM

## 2015-02-07 DIAGNOSIS — F411 Generalized anxiety disorder: Secondary | ICD-10-CM | POA: Diagnosis not present

## 2015-02-07 NOTE — Progress Notes (Signed)
   Subjective:    Patient ID: Michelle Ortega, female    DOB: 09/29/1971, 44 y.o.   MRN: [id number]  Pt presents to the office to recheck depression and GAD. Pt was started on Celexa 20 mg daily. Pt states this is helping greatly and is happy with everything at this time.  Anxiety Presents for follow-up visit. Symptoms include nervous/anxious behavior. Patient reports no confusion, depressed mood, excessive worry, palpitations, panic or shortness of breath. Symptoms occur occasionally. The symptoms are aggravated by family issues. The quality of sleep is good.   Her past medical history is significant for anxiety/panic attacks and depression. Past treatments include SSRIs. The treatment provided moderate relief. Compliance with prior treatments has been good.      Review of Systems  Constitutional: Negative.   HENT: Negative.   Eyes: Negative.   Respiratory: Negative.  Negative for shortness of breath.   Cardiovascular: Negative.  Negative for palpitations.  Gastrointestinal: Negative.   Endocrine: Negative.   Genitourinary: Negative.   Musculoskeletal: Negative.   Neurological: Negative.  Negative for headaches.  Hematological: Negative.   Psychiatric/Behavioral: Negative for confusion. The patient is nervous/anxious.   All other systems reviewed and are negative.      Objective:   Physical Exam  Constitutional: She is oriented to person, place, and time. She appears well-developed and well-nourished. No distress.  HENT:  Head: Normocephalic and atraumatic.  Right Ear: External ear normal.  Left Ear: External ear normal.  Nose: Nose normal.  Mouth/Throat: Oropharynx is clear and moist.  Eyes: Pupils are equal, round, and reactive to light.  Neck: Normal range of motion. Neck supple. No thyromegaly present.  Cardiovascular: Normal rate, regular rhythm, normal heart sounds and intact distal pulses.   No murmur heard. Pulmonary/Chest: Effort normal and breath sounds normal. No  respiratory distress. She has no wheezes.  Abdominal: Soft. Bowel sounds are normal. She exhibits no distension. There is no tenderness.  Musculoskeletal: Normal range of motion. She exhibits no edema or tenderness.  Neurological: She is alert and oriented to person, place, and time. She has normal reflexes. No cranial nerve deficit.  Skin: Skin is warm and dry.  Psychiatric: She has a normal mood and affect. Her behavior is normal. Judgment and thought content normal.  Vitals reviewed.     BP 119/79 mmHg  Pulse 82  Temp(Src) 98 F (36.7 C) (Oral)  Ht 5\' 6"  (1.676 m)  Wt 140 lb 9.6 oz (63.776 kg)  BMI 22.70 kg/m2     Assessment & Plan:  1. Depression  2. GAD (generalized anxiety disorder)  Stress management discussed Continue all meds Health Maintenance reviewed-TDAP given today Diet and exercise encouraged RTO 1 year  Jannifer Rodneyhristy Micheal Murad, FNP

## 2015-02-07 NOTE — Patient Instructions (Signed)
Generalized Anxiety Disorder Generalized anxiety disorder (GAD) is a mental disorder. It interferes with life functions, including relationships, work, and school. GAD is different from normal anxiety, which everyone experiences at some point in their lives in response to specific life events and activities. Normal anxiety actually helps us prepare for and get through these life events and activities. Normal anxiety goes away after the event or activity is over.  GAD causes anxiety that is not necessarily related to specific events or activities. It also causes excess anxiety in proportion to specific events or activities. The anxiety associated with GAD is also difficult to control. GAD can vary from mild to severe. People with severe GAD can have intense waves of anxiety with physical symptoms (panic attacks).  SYMPTOMS The anxiety and worry associated with GAD are difficult to control. This anxiety and worry are related to many life events and activities and also occur more days than not for 6 months or longer. People with GAD also have three or more of the following symptoms (one or more in children):  Restlessness.   Fatigue.  Difficulty concentrating.   Irritability.  Muscle tension.  Difficulty sleeping or unsatisfying sleep. DIAGNOSIS GAD is diagnosed through an assessment by your health care provider. Your health care provider will ask you questions aboutyour mood,physical symptoms, and events in your life. Your health care provider may ask you about your medical history and use of alcohol or drugs, including prescription medicines. Your health care provider may also do a physical exam and blood tests. Certain medical conditions and the use of certain substances can cause symptoms similar to those associated with GAD. Your health care provider may refer you to a mental health specialist for further evaluation. TREATMENT The following therapies are usually used to treat GAD:    Medication. Antidepressant medication usually is prescribed for long-term daily control. Antianxiety medicines may be added in severe cases, especially when panic attacks occur.   Talk therapy (psychotherapy). Certain types of talk therapy can be helpful in treating GAD by providing support, education, and guidance. A form of talk therapy called cognitive behavioral therapy can teach you healthy ways to think about and react to daily life events and activities.  Stress managementtechniques. These include yoga, meditation, and exercise and can be very helpful when they are practiced regularly. A mental health specialist can help determine which treatment is best for you. Some people see improvement with one therapy. However, other people require a combination of therapies. Document Released: 01/29/2013 Document Revised: 02/18/2014 Document Reviewed: 01/29/2013 ExitCare Patient Information 2015 [employer]. This information is not intended to replace advice given to you by your health care provider. Make sure you discuss any questions you have with your health care provider. Depression Depression refers to feeling sad, low, down in the dumps, blue, gloomy, or empty. In general, there are two kinds of depression:  Normal sadness or normal grief. This kind of depression is one that we all feel from time to time after upsetting life experiences, such as the loss of a job or the ending of a relationship. This kind of depression is considered normal, is short lived, and resolves within a few days to 2 weeks. Depression experienced after the loss of a loved one (bereavement) often lasts longer than 2 weeks but normally gets better with time.  Clinical depression. This kind of depression lasts longer than normal sadness or normal grief or interferes with your ability to function at home, at work, and in school.   It also interferes with your personal relationships. It affects almost every aspect of your  life. Clinical depression is an illness. Symptoms of depression can also be caused by conditions other than those mentioned above, such as:  Physical illness. Some physical illnesses, including underactive thyroid gland (hypothyroidism), severe anemia, specific types of cancer, diabetes, uncontrolled seizures, heart and lung problems, strokes, and chronic pain are commonly associated with symptoms of depression.  Side effects of some prescription medicine. In some people, certain types of medicine can cause symptoms of depression.  Substance abuse. Abuse of alcohol and illicit drugs can cause symptoms of depression. SYMPTOMS Symptoms of normal sadness and normal grief include the following:  Feeling sad or crying for short periods of time.  Not caring about anything (apathy).  Difficulty sleeping or sleeping too much.  No longer able to enjoy the things you used to enjoy.  Desire to be by oneself all the time (social isolation).  Lack of energy or motivation.  Difficulty concentrating or remembering.  Change in appetite or weight.  Restlessness or agitation. Symptoms of clinical depression include the same symptoms of normal sadness or normal grief and also the following symptoms:  Feeling sad or crying all the time.  Feelings of guilt or worthlessness.  Feelings of hopelessness or helplessness.  Thoughts of suicide or the desire to harm yourself (suicidal ideation).  Loss of touch with reality (psychotic symptoms). Seeing or hearing things that are not real (hallucinations) or having false beliefs about your life or the people around you (delusions and paranoia). DIAGNOSIS  The diagnosis of clinical depression is usually based on how bad the symptoms are and how long they have lasted. Your health care provider will also ask you questions about your medical history and substance use to find out if physical illness, use of prescription medicine, or substance abuse is causing  your depression. Your health care provider may also order blood tests. TREATMENT  Often, normal sadness and normal grief do not require treatment. However, sometimes antidepressant medicine is given for bereavement to ease the depressive symptoms until they resolve. The treatment for clinical depression depends on how bad the symptoms are but often includes antidepressant medicine, counseling with a mental health professional, or both. Your health care provider will help to determine what treatment is best for you. Depression caused by physical illness usually goes away with appropriate medical treatment of the illness. If prescription medicine is causing depression, talk with your health care provider about stopping the medicine, decreasing the dose, or changing to another medicine. Depression caused by the abuse of alcohol or illicit drugs goes away when you stop using these substances. Some adults need professional help in order to stop drinking or using drugs. SEEK IMMEDIATE MEDICAL CARE IF:  You have thoughts about hurting yourself or others.  You lose touch with reality (have psychotic symptoms).  You are taking medicine for depression and have a serious side effect. FOR MORE INFORMATION  National Alliance on Mental Illness: www.nami.org  National Institute of Mental Health: www.nimh.nih.gov Document Released: 10/01/2000 Document Revised: 02/18/2014 Document Reviewed: 01/03/2012 ExitCare Patient Information 2015 [employer]. This information is not intended to replace advice given to you by your health care provider. Make sure you discuss any questions you have with your health care provider.  

## 2015-02-17 ENCOUNTER — Other Ambulatory Visit: Payer: Self-pay | Admitting: Family

## 2015-02-18 ENCOUNTER — Other Ambulatory Visit: Payer: Self-pay | Admitting: Family

## 2015-08-13 ENCOUNTER — Encounter: Payer: Self-pay | Admitting: Family Medicine

## 2015-08-13 ENCOUNTER — Ambulatory Visit (INDEPENDENT_AMBULATORY_CARE_PROVIDER_SITE_OTHER): Payer: 59 | Admitting: Family Medicine

## 2015-08-13 VITALS — BP 121/79 | HR 91 | Temp 97.6°F | Ht 66.0 in | Wt 143.4 lb

## 2015-08-13 DIAGNOSIS — J209 Acute bronchitis, unspecified: Secondary | ICD-10-CM | POA: Diagnosis not present

## 2015-08-13 MED ORDER — AZITHROMYCIN 250 MG PO TABS: ORAL_TABLET | ORAL | Status: DC

## 2015-08-13 MED ORDER — HYDROCODONE-HOMATROPINE 5-1.5 MG/5ML PO SYRP: 5.0000 mL | ORAL_SOLUTION | Freq: Three times a day (TID) | ORAL | Status: DC | PRN

## 2015-08-13 NOTE — Progress Notes (Signed)
   Subjective:    Patient ID: Michelle DodgeDiana W Fix, female    DOB: 10/03/1971, 44 y.o.   MRN: [id number]  HPI  [age] year old female with with cough and congestion. She is a smoker and she has a history of bronchitis and she says that what she is coughing up is yellow and tastes like infection. There is also some sinus congestion and drainage. She has heard some wheezes but no real shortness of breath.  Patient Active Problem List   Diagnosis Date Noted  . GAD (generalized anxiety disorder) 02/07/2015  . Vitamin D deficiency 01/22/2015  . Depression 01/20/2015  . Pre-syncope 06/06/2013  . Vertigo 06/06/2013  . Headache(784.0) 06/06/2013  . Hypothyroidism 06/06/2013   Outpatient Encounter Prescriptions as of 08/13/2015  Medication Sig  . citalopram (CELEXA) 20 MG tablet Take 1 tablet (20 mg total) by mouth daily.  Marland Kitchen. levothyroxine (SYNTHROID, LEVOTHROID) 75 MCG tablet Take 1 tablet (75 mcg total) by mouth daily.  . meloxicam (MOBIC) 15 MG tablet TAKE 1 TABLET(15 MG) BY MOUTH DAILY  . Vitamin D, Ergocalciferol, (DRISDOL) 50000 UNITS CAPS capsule Take 1 capsule (50,000 Units total) by mouth every 7 (seven) days.   No facility-administered encounter medications on file as of 08/13/2015.      Review of Systems  Constitutional: Negative.   HENT: Positive for congestion and postnasal drip.   Respiratory: Positive for wheezing.   Cardiovascular: Negative.   Genitourinary: Positive for hematuria.  Neurological: Negative.        Objective:   Physical Exam  Constitutional: She is oriented to person, place, and time. She appears well-developed and well-nourished.  HENT:  Head: Normocephalic.  Mouth/Throat: Oropharynx is clear and moist.  Cardiovascular: Normal rate and regular rhythm.   Pulmonary/Chest: Effort normal. She has wheezes.  Neurological: She is alert and oriented to person, place, and time.  Psychiatric: She has a normal mood and affect.          Assessment & Plan:  1. Acute  bronchitis, unspecified organism Patient urged to quit smoking. Z-Pak and Mucinex and plenty of fluids Hycodan one or 2 teaspoons at bedtime Frederica KusterStephen M Miller MD Notice: Thisdictation was prepared with Dragon dictation along with smaller phrase technology. Any transcriptional errors that result from this process are unintentional and may not be corrected upon review

## 2016-01-22 ENCOUNTER — Other Ambulatory Visit: Payer: Self-pay | Admitting: Family

## 2016-09-27 ENCOUNTER — Encounter: Payer: Self-pay | Admitting: Family

## 2016-09-27 ENCOUNTER — Ambulatory Visit (INDEPENDENT_AMBULATORY_CARE_PROVIDER_SITE_OTHER): Payer: Self-pay | Admitting: Family

## 2016-09-27 VITALS — BP 128/84 | HR 87 | Temp 97.2°F | Ht 66.0 in | Wt 132.8 lb

## 2016-09-27 DIAGNOSIS — J329 Chronic sinusitis, unspecified: Secondary | ICD-10-CM

## 2016-09-27 DIAGNOSIS — B9789 Other viral agents as the cause of diseases classified elsewhere: Secondary | ICD-10-CM

## 2016-09-27 MED ORDER — FLUTICASONE PROPIONATE 50 MCG/ACT NA SUSP: 2.0000 | Freq: Every day | NASAL | 6 refills | Status: DC

## 2016-09-27 NOTE — Patient Instructions (Signed)

## 2016-09-27 NOTE — Progress Notes (Signed)
   Subjective:    Patient ID: Michelle DodgeDiana W Selman, female    DOB: 03/13/1971, 45 y.o.   MRN: [id number]  Sinus Problem  This is a new problem. The current episode started in the past 7 days. The problem has been gradually worsening since onset. There has been no fever. Her pain is at a severity of 2/10. Associated symptoms include chills, congestion, coughing, headaches, a hoarse voice, sinus pressure, sneezing and a sore throat. Pertinent negatives include no ear pain. Past treatments include lying down and oral decongestants. The treatment provided mild relief.      Review of Systems  Constitutional: Positive for chills.  HENT: Positive for congestion, hoarse voice, sinus pressure, sneezing and sore throat. Negative for ear pain.   Respiratory: Positive for cough.   Neurological: Positive for headaches.  All other systems reviewed and are negative.      Objective:   Physical Exam  Constitutional: She is oriented to person, place, and time. She appears well-developed and well-nourished. No distress.  HENT:  Head: Normocephalic and atraumatic.  Right Ear: External ear normal.  Left Ear: External ear normal.  Nose: Mucosal edema and rhinorrhea present. Right sinus exhibits maxillary sinus tenderness and frontal sinus tenderness.  Mouth/Throat: Posterior oropharyngeal erythema present.  Eyes: Pupils are equal, round, and reactive to light.  Neck: Normal range of motion. Neck supple. No thyromegaly present.  Cardiovascular: Normal rate, regular rhythm, normal heart sounds and intact distal pulses.   No murmur heard. Pulmonary/Chest: Effort normal and breath sounds normal. No respiratory distress. She has no wheezes.  Abdominal: Soft. Bowel sounds are normal. She exhibits no distension. There is no tenderness.  Musculoskeletal: Normal range of motion. She exhibits no edema or tenderness.  Neurological: She is alert and oriented to person, place, and time. She has normal reflexes. No cranial  nerve deficit.  Skin: Skin is warm and dry.  Psychiatric: She has a normal mood and affect. Her behavior is normal. Judgment and thought content normal.  Vitals reviewed.     BP 128/84   Pulse 87   Temp 97.2 F (36.2 C) (Oral)   Ht 5\' 6"  (1.676 m)   Wt 132 lb 12.8 oz (60.2 kg)   BMI 21.43 kg/m       Assessment & Plan:  1. Viral sinusitis - Take meds as prescribed - Use a cool mist humidifier  -Use saline nose sprays frequently -Saline irrigations of the nose can be very helpful if done frequently.  * 4X daily for 1 week*  * Use of a nettie pot can be helpful with this. Follow directions with this* -Force fluids -For any cough or congestion  Use plain Mucinex- regular strength or max strength is fine   * Children- consult with Pharmacist for dosing -For fever or aces or pains- take tylenol or ibuprofen appropriate for age and weight.  * for fevers greater than 101 orally you may alternate ibuprofen and tylenol every  3 hours. -Throat lozenges if help -New toothbrush in 3 days - fluticasone (FLONASE) 50 MCG/ACT nasal spray; Place 2 sprays into both nostrils daily.  Dispense: 16 g; Refill: 6  Jannifer Rodneyhristy Issaic Welliver, FNP

## 2016-10-04 IMAGING — CR DG HAND COMPLETE 3+V*R*
3 series · 3 of 3 positions shown · non-contrast
Comparison: None.

CLINICAL DATA: Progressive right wrist pain ; has a occupation with
repetitive motion.

EXAM:
RIGHT HAND - COMPLETE 3+ VIEW

[view not recorded (1 of 3)]
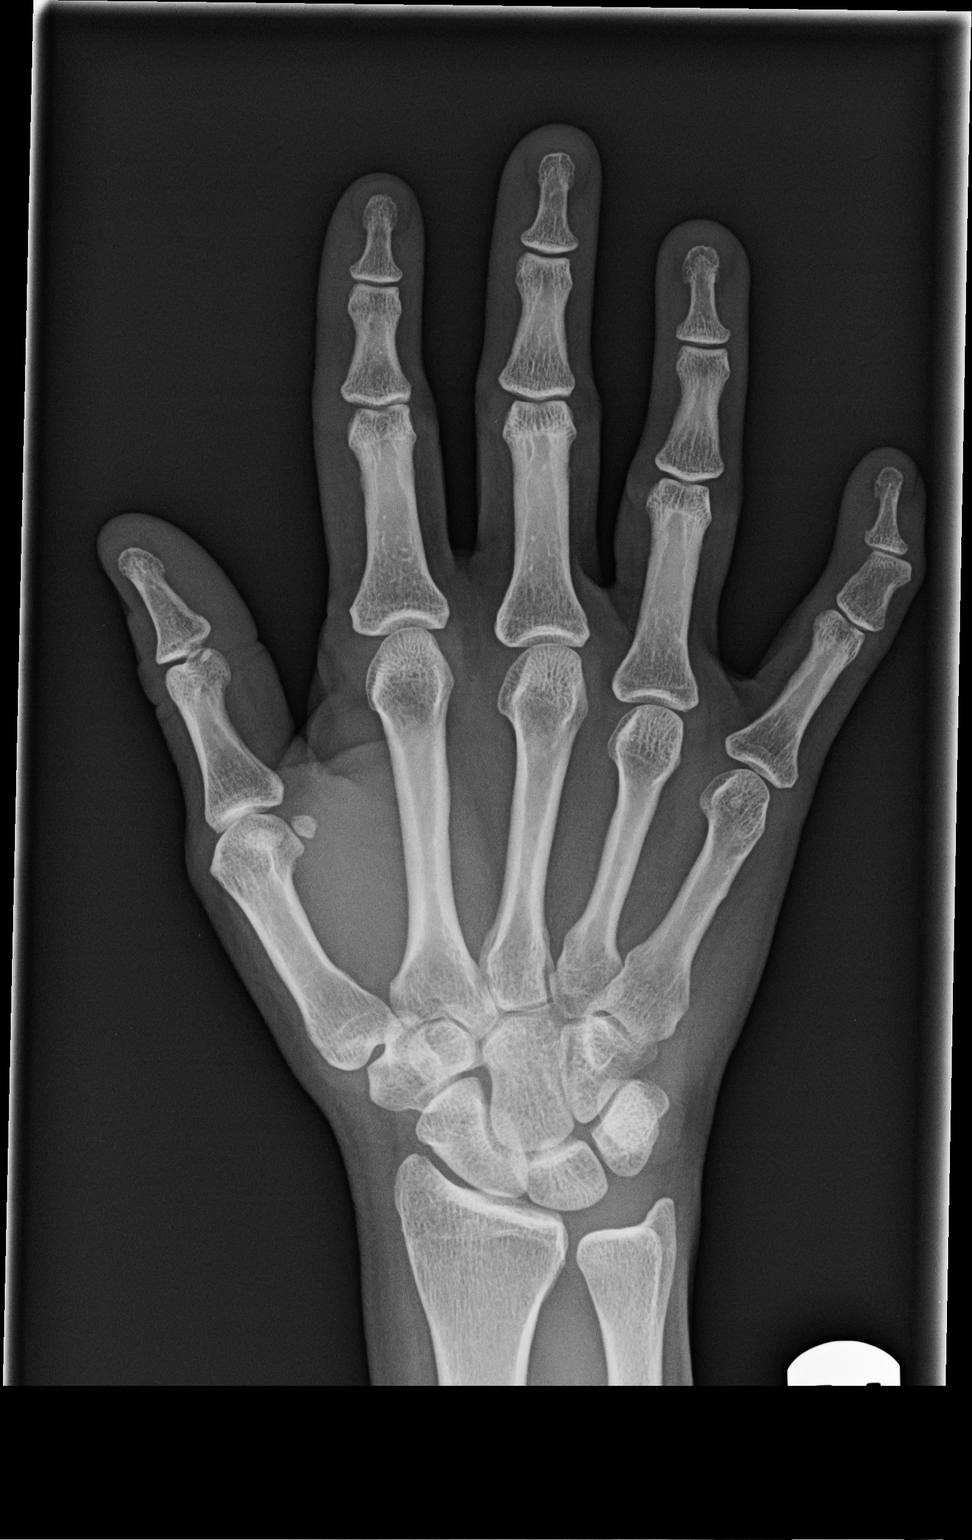

[view not recorded (2 of 3)]
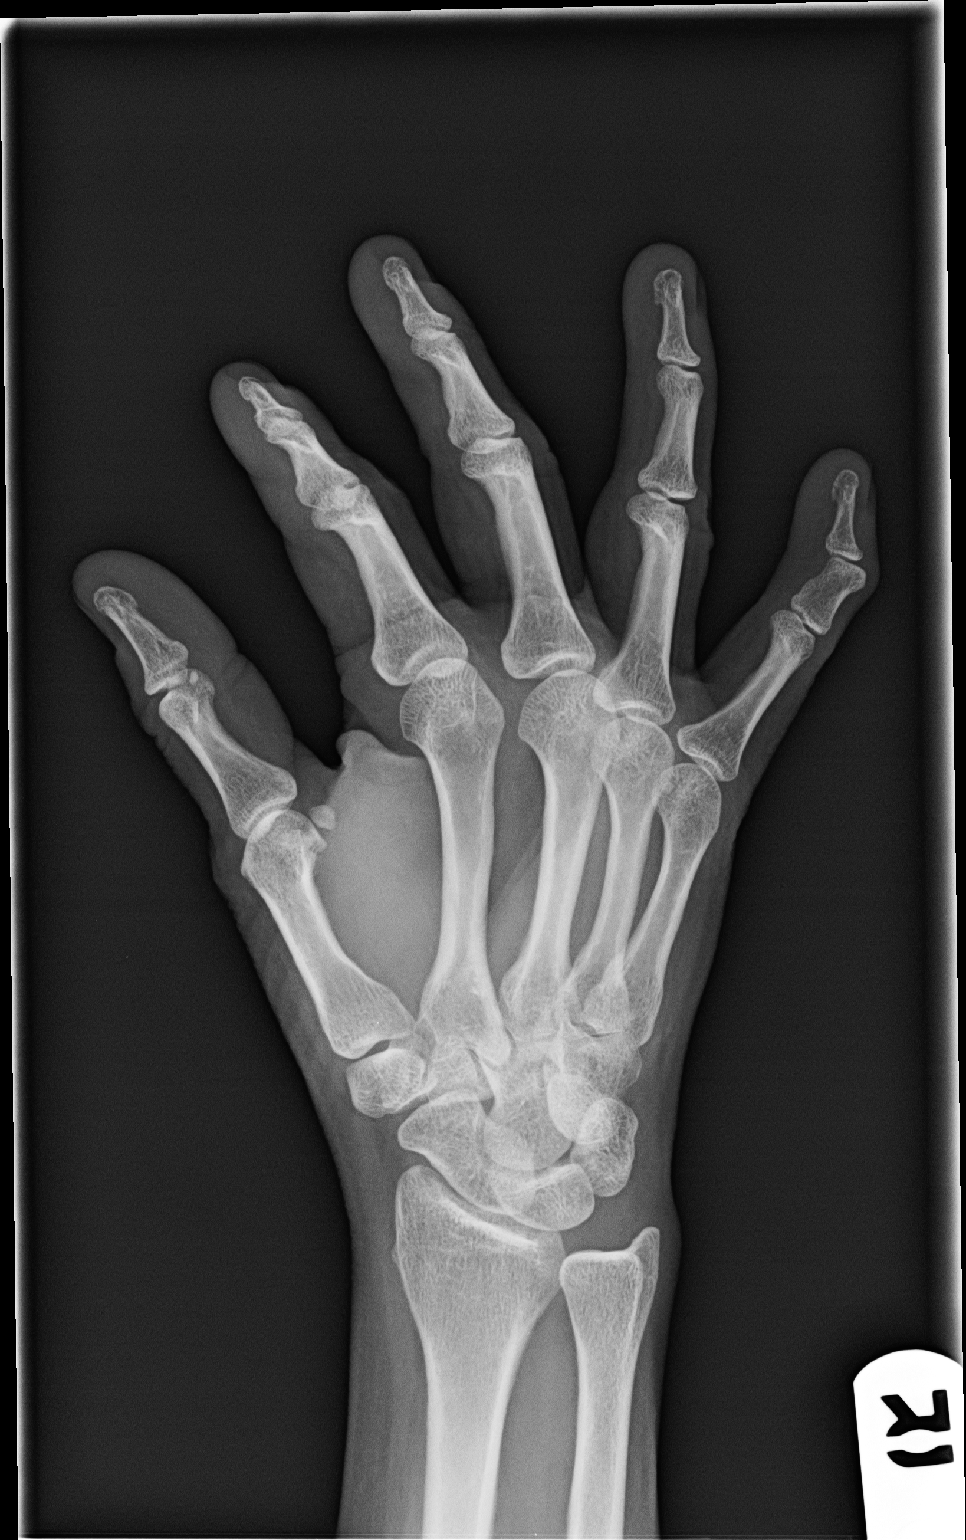

[view not recorded (3 of 3)]
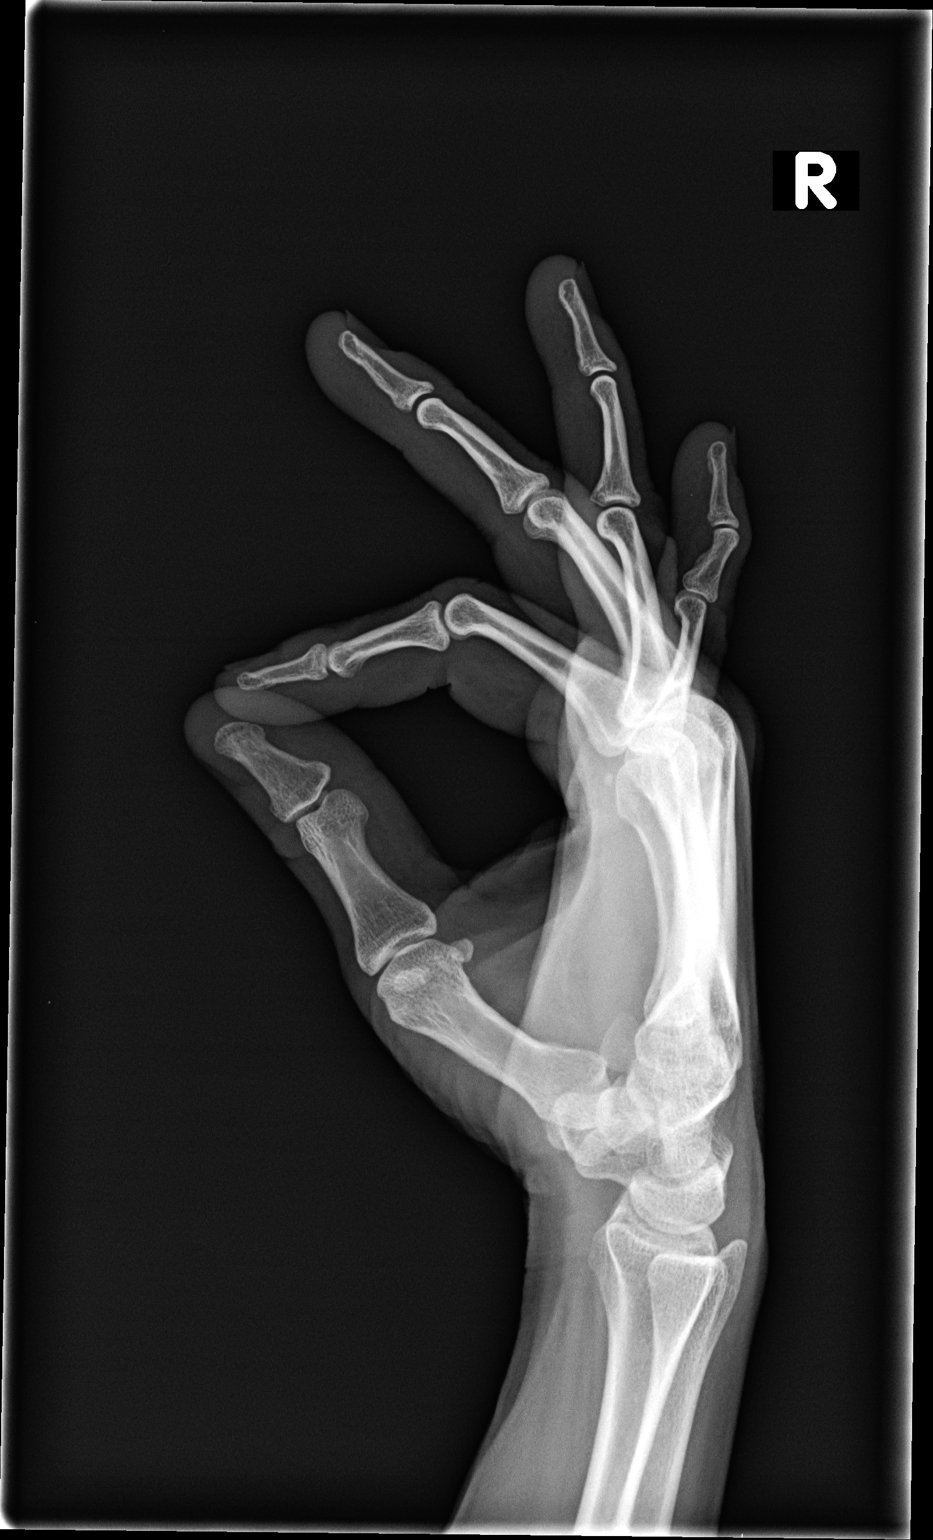

[3 of 3 positions shown; findings below may reference images not displayed]

FINDINGS: The bones of the hand are adequately mineralized. There is mild
radial subluxation of the distal phalanx of the fifth finger with
respect to the middle phalanx which appears chronic. The joint
spaces are preserved. There is no acute or healing fracture of the
bones of the hand.

The carpal bones appear adequately mineralized. There is
preservation of the intercarpal, carpometacarpal, and radiocarpal
joint spaces. The ulnocarpal joint space is also normal. The soft
tissues are unremarkable.
IMPRESSION: There is no acute bony abnormality nor evidence of arthritis of the
right hand or visualized portions of the right wrist.

## 2016-12-07 ENCOUNTER — Encounter: Payer: Self-pay | Admitting: Physician Assistant

## 2016-12-07 ENCOUNTER — Ambulatory Visit (INDEPENDENT_AMBULATORY_CARE_PROVIDER_SITE_OTHER): Payer: 59 | Admitting: Physician Assistant

## 2016-12-07 VITALS — BP 103/76 | HR 81 | Temp 98.0°F | Ht 66.0 in | Wt 126.6 lb

## 2016-12-07 DIAGNOSIS — J40 Bronchitis, not specified as acute or chronic: Secondary | ICD-10-CM

## 2016-12-07 MED ORDER — PREDNISONE 10 MG (21) PO TBPK: ORAL_TABLET | ORAL | 0 refills | Status: DC

## 2016-12-07 MED ORDER — HYDROCOD POLST-CPM POLST ER 10-8 MG/5ML PO SUER: 5.0000 mL | Freq: Two times a day (BID) | ORAL | 0 refills | Status: DC | PRN

## 2016-12-07 MED ORDER — AZITHROMYCIN 250 MG PO TABS: ORAL_TABLET | ORAL | 0 refills | Status: DC

## 2016-12-07 NOTE — Patient Instructions (Signed)

## 2016-12-07 NOTE — Progress Notes (Signed)
BP 103/76   Pulse 81   Temp 98 F (36.7 C) (Oral)   Ht 5\' 6"  (1.676 m)   Wt 126 lb 9.6 oz (57.4 kg)   BMI 20.43 kg/m    Subjective:    Patient ID: Michelle Ortega, female    DOB: [birth year], 46 y.o.   MRN: [id number]  HPI: Michelle Ortega is a 46 y.o. female presenting on 12/07/2016 for Cough and chest congestion  Patient with several days of progressing upper respiratory and bronchial symptoms. Initially there was more upper respiratory congestion. This progressed to having significant cough that is productive throughout the day and severe at night. There is occasional wheezing after coughing. They will sometimes have slight dyspnea on exertion. It is productive mucus that is yellow in color. Denies any blood.   Relevant past medical, surgical, family and social history reviewed and updated as indicated. Allergies and medications reviewed and updated.  Past Medical History:  Diagnosis Date  . Depression   . Thyroid disease     Past Surgical History:  Procedure Laterality Date  . BREAST LUMPECTOMY    . KNEE SURGERY Left   . TONSILLECTOMY      Review of Systems  Constitutional: Positive for chills and fatigue. Negative for activity change and appetite change.  HENT: Positive for congestion, postnasal drip and sore throat.   Eyes: Negative.   Respiratory: Positive for cough and wheezing.   Cardiovascular: Negative.  Negative for chest pain, palpitations and leg swelling.  Gastrointestinal: Negative.   Genitourinary: Negative.   Musculoskeletal: Negative.   Skin: Negative.   Neurological: Positive for headaches.    Allergies as of 12/07/2016      Reactions   Erythromycin    Penicillins       Medication List       Accurate as of 12/07/16  5:17 PM. Always use your most recent med list.          azithromycin 250 MG tablet Commonly known as:  ZITHROMAX Z-PAK Take as directed   chlorpheniramine-HYDROcodone 10-8 MG/5ML Suer Commonly known as:  TUSSIONEX Take 5  mLs by mouth every 12 (twelve) hours as needed for cough.   citalopram 20 MG tablet Commonly known as:  CELEXA Take 1 tablet (20 mg total) by mouth daily.   fluticasone 50 MCG/ACT nasal spray Commonly known as:  FLONASE Place 2 sprays into both nostrils daily.   levothyroxine 75 MCG tablet Commonly known as:  SYNTHROID, LEVOTHROID Take 1 tablet (75 mcg total) by mouth daily.   predniSONE 10 MG (21) Tbpk tablet Commonly known as:  STERAPRED UNI-PAK 21 TAB As directed x 6 days          Objective:    BP 103/76   Pulse 81   Temp 98 F (36.7 C) (Oral)   Ht 5\' 6"  (1.676 m)   Wt 126 lb 9.6 oz (57.4 kg)   BMI 20.43 kg/m   Allergies  Allergen Reactions  . Erythromycin   . Penicillins     Physical Exam  Constitutional: She is oriented to person, place, and time. She appears well-developed and well-nourished.  HENT:  Head: Normocephalic and atraumatic.  Right Ear: There is drainage and tenderness.  Left Ear: There is drainage and tenderness.  Nose: Mucosal edema and rhinorrhea present. Right sinus exhibits maxillary sinus tenderness and frontal sinus tenderness. Left sinus exhibits maxillary sinus tenderness and frontal sinus tenderness.  Mouth/Throat: Oropharyngeal exudate and posterior oropharyngeal erythema present.  Eyes: Conjunctivae and  EOM are normal. Pupils are equal, round, and reactive to light.  Neck: Normal range of motion. Neck supple.  Cardiovascular: Normal rate, regular rhythm, normal heart sounds and intact distal pulses.   Pulmonary/Chest: Effort normal. She has wheezes in the right upper field and the left upper field.  Abdominal: Soft. Bowel sounds are normal.  Neurological: She is alert and oriented to person, place, and time. She has normal reflexes.  Skin: Skin is warm and dry. No rash noted.  Psychiatric: She has a normal mood and affect. Her behavior is normal. Judgment and thought content normal.        Assessment & Plan:   1. Bronchitis -  azithromycin (ZITHROMAX Z-PAK) 250 MG tablet; Take as directed  Dispense: 6 each; Refill: 0 - chlorpheniramine-HYDROcodone (TUSSIONEX) 10-8 MG/5ML SUER; Take 5 mLs by mouth every 12 (twelve) hours as needed for cough.  Dispense: 120 mL; Refill: 0 - predniSONE (STERAPRED UNI-PAK 21 TAB) 10 MG (21) TBPK tablet; As directed x 6 days  Dispense: 21 tablet; Refill: 0   Continue all other maintenance medications as listed above.  Follow up plan: Return if symptoms worsen or fail to improve.  Educational handout given for bronchitis  Remus LofflerAngel S. Janera Peugh PA-C [facility] [address]  [place], [place] [id number] [phone]   12/07/2016, 5:17 PM

## 2021-02-19 ENCOUNTER — Ambulatory Visit: Payer: Medicaid Other

## 2021-02-19 ENCOUNTER — Ambulatory Visit (INDEPENDENT_AMBULATORY_CARE_PROVIDER_SITE_OTHER): Payer: Medicaid Other

## 2021-02-19 ENCOUNTER — Encounter: Payer: Self-pay | Admitting: Family

## 2021-02-19 ENCOUNTER — Ambulatory Visit (INDEPENDENT_AMBULATORY_CARE_PROVIDER_SITE_OTHER): Payer: Medicaid Other | Admitting: Family

## 2021-02-19 ENCOUNTER — Other Ambulatory Visit: Payer: Self-pay

## 2021-02-19 VITALS — BP 128/91 | HR 133 | Temp 97.9°F | Ht 66.0 in | Wt 120.8 lb

## 2021-02-19 DIAGNOSIS — M25512 Pain in left shoulder: Secondary | ICD-10-CM

## 2021-02-19 DIAGNOSIS — M542 Cervicalgia: Secondary | ICD-10-CM

## 2021-02-19 DIAGNOSIS — R000 Tachycardia, unspecified: Secondary | ICD-10-CM

## 2021-02-19 DIAGNOSIS — Z0000 Encounter for general adult medical examination without abnormal findings: Secondary | ICD-10-CM

## 2021-02-19 DIAGNOSIS — F411 Generalized anxiety disorder: Secondary | ICD-10-CM

## 2021-02-19 DIAGNOSIS — Z0001 Encounter for general adult medical examination with abnormal findings: Secondary | ICD-10-CM

## 2021-02-19 DIAGNOSIS — Z1211 Encounter for screening for malignant neoplasm of colon: Secondary | ICD-10-CM

## 2021-02-19 DIAGNOSIS — M25511 Pain in right shoulder: Secondary | ICD-10-CM

## 2021-02-19 DIAGNOSIS — F331 Major depressive disorder, recurrent, moderate: Secondary | ICD-10-CM

## 2021-02-19 DIAGNOSIS — E039 Hypothyroidism, unspecified: Secondary | ICD-10-CM

## 2021-02-19 DIAGNOSIS — Z1159 Encounter for screening for other viral diseases: Secondary | ICD-10-CM

## 2021-02-19 DIAGNOSIS — M5412 Radiculopathy, cervical region: Secondary | ICD-10-CM

## 2021-02-19 DIAGNOSIS — E559 Vitamin D deficiency, unspecified: Secondary | ICD-10-CM

## 2021-02-19 MED ORDER — DULOXETINE HCL 60 MG PO CPEP: 60.0000 mg | ORAL_CAPSULE | Freq: Every day | ORAL | 1 refills | Status: DC

## 2021-02-19 MED ORDER — DICLOFENAC SODIUM 75 MG PO TBEC: 75.0000 mg | DELAYED_RELEASE_TABLET | Freq: Two times a day (BID) | ORAL | 2 refills | Status: DC

## 2021-02-19 MED ORDER — DULOXETINE HCL 30 MG PO CPEP: ORAL_CAPSULE | ORAL | 0 refills | Status: DC

## 2021-02-19 NOTE — Patient Instructions (Signed)
Cervical Radiculopathy  Cervical radiculopathy happens when a nerve in the neck (a cervical nerve) is pinched or bruised. This condition can happen because of an injury to the cervical spine (vertebrae) in the neck, or as part of the normal aging process. Pressure on the cervical nerves can cause pain or numbness that travels from the neck all the way down into the arm and fingers. Usually, this condition gets better with rest. Treatment may be needed if the condition does not improve. What are the causes? This condition may be caused by:  A neck injury.  A bulging (herniated) disk.  Muscle spasms.  Muscle tightness in the neck because of overuse.  Arthritis.  Breakdown or degeneration in the bones and joints of the spine (spondylosis) due to aging.  Bone spurs that may develop near the cervical nerves. What are the signs or symptoms? Symptoms of this condition include:  Pain. The pain may travel from the neck to the arm and hand. The pain can be severe or irritating. It may be worse when you move your neck.  Numbness or tingling in your arm or hand.  Weakness in the affected arm and hand, in severe cases. How is this diagnosed? This condition may be diagnosed based on your symptoms, your medical history, and a physical exam. You may also have tests, including:  X-rays.  A CT scan.  An MRI.  An electromyogram (EMG).  Nerve conduction tests. How is this treated? In many cases, treatment is not needed for this condition. With rest, the condition usually gets better over time. If treatment is needed, options may include:  Wearing a soft neck collar (cervical collar) for short periods of time, as told by your health care provider.  Doing physical therapy to strengthen your neck muscles.  Taking medicines, such as NSAIDs or oral corticosteroids.  Having spinal injections, in severe cases.  Having surgery. This may be needed if other treatments do not help. Different types  of surgery may be done depending on the cause of this condition. Follow these instructions at home: If you have a cervical collar:  Wear it as told by your health care provider. Remove it only as told by your health care provider.  Ask your health care provider if you can remove the collar for cleaning and bathing. If you are allowed to remove the collar for cleaning or bathing: ? Follow instructions from your health care provider about how to remove the collar safely. ? Clean the collar by wiping it with mild soap and water and drying it completely. ? Take out any removable pads in the collar every 1-2 days, and wash them by hand with soap and water. Let them air-dry completely before you put them back in the collar. ? Check your skin under the collar for irritation or sores. If you see any, tell your health care provider. Managing pain  Take over-the-counter and prescription medicines only as told by your health care provider.  If directed, put ice on the affected area. ? If you have a soft neck collar, remove it as told by your health care provider. ? Put ice in a plastic bag. ? Place a towel between your skin and the bag. ? Leave the ice on for 20 minutes, 2-3 times a day.  If applying ice does not help, you can try using heat. Use the heat source that your health care provider recommends, such as a moist heat pack or a heating pad. ? Place a towel   between your skin and the heat source. ? Leave the heat on for 20-30 minutes. ? Remove the heat if your skin turns bright red. This is especially important if you are unable to feel pain, heat, or cold. You may have a greater risk of getting burned.  Try a gentle neck and shoulder massage to help relieve symptoms.      Activity  Rest as needed.  Return to your normal activities as told by your health care provider. Ask your health care provider what activities are safe for you.  Do stretching and strengthening exercises as told by  your health care provider or physical therapist.  Do not lift anything that is heavier than 10 lb (4.5 kg) until your health care provider tells you that it is safe. General instructions  Use a flat pillow when you sleep.  Do not drive while wearing a cervical collar. If you do not have a cervical collar, ask your health care provider if it is safe to drive while your neck heals.  Ask your health care provider if the medicine prescribed to you requires you to avoid driving or using heavy machinery.  Do not use any products that contain nicotine or tobacco, such as cigarettes, e-cigarettes, and chewing tobacco. These can delay healing. If you need help quitting, ask your health care provider.  Keep all follow-up visits as told by your health care provider. This is important. Contact a health care provider if:  Your condition does not improve with treatment. Get help right away if:  Your pain gets much worse and cannot be controlled with medicines.  You have weakness or numbness in your hand, arm, face, or leg.  You have a high fever.  You have a stiff, rigid neck.  You lose control of your bowels or your bladder (have incontinence).  You have trouble with walking, balance, or speaking. Summary  Cervical radiculopathy happens when a nerve in the neck is pinched or bruised.  A nerve can get pinched from a bulging disk, arthritis, muscle spasms, or an injury to the neck.  Symptoms include pain, tingling, or numbness radiating from the neck into the arm or hand. Weakness can also occur in severe cases.  Treatment may include rest, wearing a cervical collar, and physical therapy. Medicines may be prescribed to help with pain. In severe cases, injections or surgery may be needed. This information is not intended to replace advice given to you by your health care provider. Make sure you discuss any questions you have with your health care provider. Document Revised: 08/25/2018  Document Reviewed: 08/25/2018 Elsevier Patient Education  2021 [employer] Hypothyroidism  Hypothyroidism is when the thyroid gland does not make enough of certain hormones (it is underactive). The thyroid gland is a small gland located in the lower front part of the neck, just in front of the windpipe (trachea). This gland makes hormones that help control how the body uses food for energy (metabolism) as well as how the heart and brain function. These hormones also play a role in keeping your bones strong. When the thyroid is underactive, it produces too little of the hormones thyroxine (T4) and triiodothyronine (T3). What are the causes? This condition may be caused by:  Hashimoto's disease. This is a disease in which the body's disease-fighting system (immune system) attacks the thyroid gland. This is the most common cause.  Viral infections.  Pregnancy.  Certain medicines.  Birth defects.  Past radiation treatments to the head or neck  for cancer.  Past treatment with radioactive iodine.  Past exposure to radiation in the environment.  Past surgical removal of part or all of the thyroid.  Problems with a gland in the center of the brain (pituitary gland).  Lack of enough iodine in the diet. What increases the risk? You are more likely to develop this condition if:  You are female.  You have a family history of thyroid conditions.  You use a medicine called lithium.  You take medicines that affect the immune system (immunosuppressants). What are the signs or symptoms? Symptoms of this condition include:  Feeling as though you have no energy (lethargy).  Not being able to tolerate cold.  Weight gain that is not explained by a change in diet or exercise habits.  Lack of appetite.  Dry skin.  Coarse hair.  Menstrual irregularity.  Slowing of thought processes.  Constipation.  Sadness or depression. How is this diagnosed? This condition may be diagnosed  based on:  Your symptoms, your medical history, and a physical exam.  Blood tests. You may also have imaging tests, such as an ultrasound or MRI. How is this treated? This condition is treated with medicine that replaces the thyroid hormones that your body does not make. After you begin treatment, it may take several weeks for symptoms to go away. Follow these instructions at home:  Take over-the-counter and prescription medicines only as told by your health care provider.  If you start taking any new medicines, tell your health care provider.  Keep all follow-up visits as told by your health care provider. This is important. ? As your condition improves, your dosage of thyroid hormone medicine may change. ? You will need to have blood tests regularly so that your health care provider can monitor your condition. Contact a health care provider if:  Your symptoms do not get better with treatment.  You are taking thyroid hormone replacement medicine and you: ? Sweat a lot. ? Have tremors. ? Feel anxious. ? Lose weight rapidly. ? Cannot tolerate heat. ? Have emotional swings. ? Have diarrhea. ? Feel weak. Get help right away if you have:  Chest pain.  An irregular heartbeat.  A rapid heartbeat.  Difficulty breathing. Summary  Hypothyroidism is when the thyroid gland does not make enough of certain hormones (it is underactive).  When the thyroid is underactive, it produces too little of the hormones thyroxine (T4) and triiodothyronine (T3).  The most common cause is Hashimoto's disease, a disease in which the body's disease-fighting system (immune system) attacks the thyroid gland. The condition can also be caused by viral infections, medicine, pregnancy, or past radiation treatment to the head or neck.  Symptoms may include weight gain, dry skin, constipation, feeling as though you do not have energy, and not being able to tolerate cold.  This condition is treated with  medicine to replace the thyroid hormones that your body does not make. This information is not intended to replace advice given to you by your health care provider. Make sure you discuss any questions you have with your health care provider. Document Revised: 07/04/2020 Document Reviewed: 06/19/2020 Elsevier Patient Education  2021 [employer].

## 2021-02-19 NOTE — Progress Notes (Signed)
Subjective:    Patient ID: Michelle Ortega, female    DOB: 1971/06/06, 50 y.o.   MRN: [id number]   Chief Complaint  Patient presents with  . Hip Pain  . Neck Pain   Pt presents to the office today for CPE without pap. Currently note taking any medications. She had an elevated TSH 01/20/15 and was started on medication. She reports she lost insurance and has not taken any medications over the last year.   She reports her sister died of a MI at the age of 63 years old.  Neck Pain  This is a new problem. The current episode started more than 1 month ago. The problem occurs constantly. The problem has been waxing and waning. The pain is associated with nothing. The pain is present in the midline and anterior neck. The quality of the pain is described as aching. The pain is at a severity of 10/10. The pain is moderate. Associated symptoms include tingling. She has tried acetaminophen and NSAIDs for the symptoms.  Thyroid Problem Presents for follow-up visit. Symptoms include anxiety, depressed mood, dry skin and fatigue. Patient reports no constipation. The symptoms have been worsening.  Anxiety Presents for follow-up visit. Symptoms include depressed mood, excessive worry, irritability, nervous/anxious behavior and restlessness. The quality of sleep is good.    Depression        This is a chronic problem.  The current episode started more than 1 year ago.   The onset quality is gradual.   The problem occurs intermittently.  Associated symptoms include fatigue, helplessness, hopelessness, irritable, restlessness, decreased interest and sad.  Past treatments include nothing.  Past medical history includes thyroid problem and anxiety.   Nicotine Dependence Presents for follow-up visit. Symptoms include fatigue and irritability. Her urge triggers include company of smokers. The symptoms have been stable. She smokes 1 pack of cigarettes per day.      Review of Systems  Constitutional: Positive  for fatigue and irritability.  Gastrointestinal: Negative for constipation.  Musculoskeletal: Positive for neck pain.  Neurological: Positive for tingling.  Psychiatric/Behavioral: Positive for depression. The patient is nervous/anxious.   All other systems reviewed and are negative.  Family History  Problem Relation Age of Onset  . Hypothyroidism Mother   . Diabetes Father   . Diabetes Sister   . Diabetes Brother    Social History   Socioeconomic History  . Marital status: Divorced    Spouse name: Not on file  . Number of children: Not on file  . Years of education: Not on file  . Highest education level: Not on file  Occupational History  . Not on file  Tobacco Use  . Smoking status: Current Every Day Smoker    Types: Cigarettes    Start date: 06/06/1988  . Smokeless tobacco: Never Used  Substance and Sexual Activity  . Alcohol use: Yes    Comment: rare  . Drug use: No  . Sexual activity: Not on file  Other Topics Concern  . Not on file  Social History Narrative  . Not on file   Social Determinants of Health   Financial Resource Strain: Not on file  Food Insecurity: Not on file  Transportation Needs: Not on file  Physical Activity: Not on file  Stress: Not on file  Social Connections: Not on file        Objective:   Physical Exam Vitals reviewed.  Constitutional:      General: She is irritable. She is not  in acute distress.    Appearance: She is well-developed.  HENT:     Head: Normocephalic and atraumatic.     Right Ear: Tympanic membrane normal.     Left Ear: Tympanic membrane normal.  Eyes:     Pupils: Pupils are equal, round, and reactive to light.  Neck:     Thyroid: No thyromegaly.  Cardiovascular:     Rate and Rhythm: Normal rate and regular rhythm.     Heart sounds: Normal heart sounds. No murmur heard.   Pulmonary:     Effort: Pulmonary effort is normal. No respiratory distress.     Breath sounds: Normal breath sounds. No wheezing.   Abdominal:     General: Bowel sounds are normal. There is no distension.     Palpations: Abdomen is soft.     Tenderness: There is no abdominal tenderness.  Musculoskeletal:        General: No tenderness. Normal range of motion.     Cervical back: Normal range of motion and neck supple.  Skin:    General: Skin is warm and dry.  Neurological:     Mental Status: She is alert and oriented to person, place, and time.     Cranial Nerves: No cranial nerve deficit.     Deep Tendon Reflexes: Reflexes are normal and symmetric.  Psychiatric:        Behavior: Behavior normal.        Thought Content: Thought content normal.        Judgment: Judgment normal.       BP (!) 128/91   Pulse (!) 133   Temp 97.9 F (36.6 C)   Ht _0  (1.676 m)   Wt 120 lb 12.8 oz (54.8 kg)   SpO2 95%   BMI 19.50 kg/m      Assessment & Plan:  Michelle Ortega comes in today with chief complaint of Hip Pain and Neck Pain (Bilateral pain in neck down to shoulders, across upper back and chest, also pain down arms, wrist, and hands)   Diagnosis and orders addressed:  1. Annual physical exam - CMP14+EGFR - CBC with Differential/Platelet - Lipid panel - TSH - VITAMIN D 25 Hydroxy (Vit-D Deficiency, Fractures) - Hepatitis C antibody - Cologuard  2. Hypothyroidism, unspecified type - CMP14+EGFR - CBC with Differential/Platelet - TSH  3. Moderate episode of recurrent major depressive disorder (HCC) - CMP14+EGFR - CBC with Differential/Platelet - DULoxetine (CYMBALTA) 30 MG capsule; Take 1 capsule (30 mg total) by mouth daily for 14 days, THEN 2 capsules (60 mg total) daily for 14 days.  Dispense: 42 capsule; Refill: 0 - DULoxetine (CYMBALTA) 60 MG capsule; Take 1 capsule (60 mg total) by mouth daily.  Dispense: 90 capsule; Refill: 1  4. GAD (generalized anxiety disorder) - CMP14+EGFR - CBC with Differential/Platelet - DULoxetine (CYMBALTA) 30 MG capsule; Take 1 capsule (30 mg total) by mouth daily  for 14 days, THEN 2 capsules (60 mg total) daily for 14 days.  Dispense: 42 capsule; Refill: 0 - DULoxetine (CYMBALTA) 60 MG capsule; Take 1 capsule (60 mg total) by mouth daily.  Dispense: 90 capsule; Refill: 1  5. Vitamin D deficiency - CMP14+EGFR - CBC with Differential/Platelet - VITAMIN D 25 Hydroxy (Vit-D Deficiency, Fractures)  6. Colon cancer screening - CMP14+EGFR - CBC with Differential/Platelet - Cologuard  7. Need for hepatitis C screening test - CMP14+EGFR - CBC with Differential/Platelet - Hepatitis C antibody  8. Neck pain - DG Cervical Spine Complete; Future -  diclofenac (VOLTAREN) 75 MG EC tablet; Take 1 tablet (75 mg total) by mouth 2 (two) times daily.  Dispense: 60 tablet; Refill: 2  9. Cervical radiculopathy - DG Cervical Spine Complete; Future - diclofenac (VOLTAREN) 75 MG EC tablet; Take 1 tablet (75 mg total) by mouth 2 (two) times daily.  Dispense: 60 tablet; Refill: 2  10. Tachycardia - EKG 12-Lead  11. Acute pain of both shoulders - DG Shoulder Right; Future - diclofenac (VOLTAREN) 75 MG EC tablet; Take 1 tablet (75 mg total) by mouth 2 (two) times daily.  Dispense: 60 tablet; Refill: 2  Started Cymbalta 30 mg today then increase to 60 mg in two weeks Start diclofenac 75 mg mg BID  Labs pending Health Maintenance reviewed Diet and exercise encouraged  Follow up plan: 2 weeks    Evelina Dun, FNP

## 2021-02-20 ENCOUNTER — Telehealth: Payer: Self-pay

## 2021-02-20 ENCOUNTER — Other Ambulatory Visit: Payer: Self-pay | Admitting: Family

## 2021-02-20 LAB — CBC WITH DIFFERENTIAL/PLATELET
Basophils Absolute: 0 10*3/uL (ref 0.0–0.2)
Basos: 0 %
EOS (ABSOLUTE): 0 10*3/uL (ref 0.0–0.4)
Eos: 0 %
Hematocrit: 41 % (ref 34.0–46.6)
Hemoglobin: 13.6 g/dL (ref 11.1–15.9)
Immature Grans (Abs): 0 10*3/uL (ref 0.0–0.1)
Immature Granulocytes: 0 %
Lymphocytes Absolute: 2.7 10*3/uL (ref 0.7–3.1)
Lymphs: 21 %
MCH: 28.9 pg (ref 26.6–33.0)
MCHC: 33.2 g/dL (ref 31.5–35.7)
MCV: 87 fL (ref 79–97)
Monocytes Absolute: 0.9 10*3/uL (ref 0.1–0.9)
Monocytes: 7 %
Neutrophils Absolute: 9.2 10*3/uL — ABNORMAL HIGH (ref 1.4–7.0)
Neutrophils: 72 %
Platelets: 433 10*3/uL (ref 150–450)
RBC: 4.71 x10E6/uL (ref 3.77–5.28)
RDW: 12.7 % (ref 11.7–15.4)
WBC: 12.9 10*3/uL — ABNORMAL HIGH (ref 3.4–10.8)

## 2021-02-20 LAB — LIPID PANEL
Chol/HDL Ratio: 3.8 ratio (ref 0.0–4.4)
Cholesterol, Total: 183 mg/dL (ref 100–199)
HDL: 48 mg/dL (ref >39)
LDL Chol Calc (NIH): 115 mg/dL — ABNORMAL HIGH (ref 0–99)
Triglycerides: 108 mg/dL (ref 0–149)
VLDL Cholesterol Cal: 20 mg/dL (ref 5–40)

## 2021-02-20 LAB — CMP14+EGFR
ALT: 6 IU/L (ref 0–32)
AST: 10 IU/L (ref 0–40)
Albumin/Globulin Ratio: 1.1 — ABNORMAL LOW (ref 1.2–2.2)
Albumin: 4.3 g/dL (ref 3.8–4.8)
Alkaline Phosphatase: 135 IU/L — ABNORMAL HIGH (ref 44–121)
BUN/Creatinine Ratio: 14 (ref 9–23)
BUN: 10 mg/dL (ref 6–24)
Bilirubin Total: 0.2 mg/dL (ref 0.0–1.2)
CO2: 19 mmol/L — ABNORMAL LOW (ref 20–29)
Calcium: 10 mg/dL (ref 8.7–10.2)
Chloride: 96 mmol/L (ref 96–106)
Creatinine, Ser: 0.69 mg/dL (ref 0.57–1.00)
Globulin, Total: 4 g/dL (ref 1.5–4.5)
Glucose: 104 mg/dL — ABNORMAL HIGH (ref 65–99)
Potassium: 3.9 mmol/L (ref 3.5–5.2)
Sodium: 138 mmol/L (ref 134–144)
Total Protein: 8.3 g/dL (ref 6.0–8.5)
eGFR: 106 mL/min/{1.73_m2} (ref >59)

## 2021-02-20 LAB — TSH: TSH: 8.76 u[IU]/mL — ABNORMAL HIGH (ref 0.450–4.500)

## 2021-02-20 LAB — VITAMIN D 25 HYDROXY (VIT D DEFICIENCY, FRACTURES): Vit D, 25-Hydroxy: 9.2 ng/mL — ABNORMAL LOW (ref 30.0–100.0)

## 2021-02-20 LAB — HEPATITIS C ANTIBODY: Hep C Virus Ab: <0.1 s/co ratio (ref 0.0–0.9)

## 2021-02-20 MED ORDER — LEVOTHYROXINE SODIUM 50 MCG PO TABS: 50.0000 ug | ORAL_TABLET | Freq: Every day | ORAL | 11 refills | Status: DC

## 2021-02-20 MED ORDER — VITAMIN D (ERGOCALCIFEROL) 1.25 MG (50000 UNIT) PO CAPS: 50000.0000 [IU] | ORAL_CAPSULE | ORAL | 3 refills | Status: DC

## 2021-02-20 NOTE — Telephone Encounter (Signed)
Patient saw Jannifer Rodney yesterday and prescriptions were sent to wrong pharmacy, should have been sent to [facility].  These meds were Levothyroxine and Vitamin D.  I called [facility] [place] and asked them to disregard these and sent new prescriptons to [facility] [place].  Patient aware.

## 2021-03-05 ENCOUNTER — Other Ambulatory Visit: Payer: Self-pay

## 2021-03-05 ENCOUNTER — Encounter: Payer: Self-pay | Admitting: Family

## 2021-03-05 ENCOUNTER — Ambulatory Visit (INDEPENDENT_AMBULATORY_CARE_PROVIDER_SITE_OTHER): Payer: Self-pay | Admitting: Family

## 2021-03-05 VITALS — BP 126/84 | HR 120 | Temp 98.2°F | Ht 66.0 in | Wt 121.8 lb

## 2021-03-05 DIAGNOSIS — F411 Generalized anxiety disorder: Secondary | ICD-10-CM

## 2021-03-05 DIAGNOSIS — F331 Major depressive disorder, recurrent, moderate: Secondary | ICD-10-CM

## 2021-03-05 DIAGNOSIS — M25511 Pain in right shoulder: Secondary | ICD-10-CM

## 2021-03-05 DIAGNOSIS — M25512 Pain in left shoulder: Secondary | ICD-10-CM

## 2021-03-05 DIAGNOSIS — R000 Tachycardia, unspecified: Secondary | ICD-10-CM

## 2021-03-05 MED ORDER — TRAMADOL HCL 50 MG PO TABS: 50.0000 mg | ORAL_TABLET | Freq: Two times a day (BID) | ORAL | 2 refills | Status: AC | PRN

## 2021-03-05 NOTE — Patient Instructions (Signed)
Shoulder Pain Many things can cause shoulder pain, including:  An injury to the shoulder.  Overuse of the shoulder.  Arthritis. The source of the pain can be:  Inflammation.  An injury to the shoulder joint.  An injury to a tendon, ligament, or bone. Follow these instructions at home: Pay attention to changes in your symptoms. Let your health care provider know about them. Follow these instructions to relieve your pain. If you have a sling:  Wear the sling as told by your health care provider. Remove it only as told by your health care provider.  Loosen the sling if your fingers tingle, become numb, or turn cold and blue.  Keep the sling clean.  If the sling is not waterproof: ? Do not let it get wet. Remove it to shower or bathe.  Move your arm as little as possible, but keep your hand moving to prevent swelling. Managing pain, stiffness, and swelling  If directed, put ice on the painful area: ? Put ice in a plastic bag. ? Place a towel between your skin and the bag. ? Leave the ice on for 20 minutes, 2-3 times per day. Stop applying ice if it does not help with the pain.  Squeeze a soft ball or a foam pad as much as possible. This helps to keep the shoulder from swelling. It also helps to strengthen the arm.   General instructions  Take over-the-counter and prescription medicines only as told by your health care provider.  Keep all follow-up visits as told by your health care provider. This is important. Contact a health care provider if:  Your pain gets worse.  Your pain is not relieved with medicines.  New pain develops in your arm, hand, or fingers. Get help right away if:  Your arm, hand, or fingers: ? Tingle. ? Become numb. ? Become swollen. ? Become painful. ? Turn white or blue. Summary  Shoulder pain can be caused by an injury, overuse, or arthritis.  Pay attention to changes in your symptoms. Let your health care provider know about  them.  This condition may be treated with a sling, ice, and pain medicines.  Contact your health care provider if the pain gets worse or new pain develops. Get help right away if your arm, hand, or fingers tingle or become numb, swollen, or painful.  Keep all follow-up visits as told by your health care provider. This is important. This information is not intended to replace advice given to you by your health care provider. Make sure you discuss any questions you have with your health care provider. Document Revised: 04/18/2018 Document Reviewed: 04/18/2018 Elsevier Patient Education  2021 Elsevier Inc.  

## 2021-03-05 NOTE — Progress Notes (Signed)
Subjective:    Patient ID: Michelle Ortega, female    DOB: [birth year], 50 y.o.   MRN: [id number]  Chief Complaint  Patient presents with  . Follow-up   Pt presents to the office today for follow up on tachycardia and chronic pain in bilateral shoulders.   She has tachycardia and last visit thought maybe her thyroid and pain was contributing. Her TSH was mild elevated, but I do not believe it is causing this significant tachycardia.   She was started on Cymbalta 30 mg and will increase to 60 mg today. She reports her anxiety and depression have greatly improved. She reports she is no longer crying every few minutes.   Shoulder Pain  The pain is present in the left shoulder and right shoulder. This is a chronic problem. The current episode started 1 to 4 weeks ago. The problem occurs constantly. The quality of the pain is described as aching. The pain is at a severity of 10/10. She has tried rest and NSAIDS for the symptoms. The treatment provided no relief.  Neck Pain  This is a chronic problem. The current episode started more than 1 year ago. The problem occurs intermittently.      Review of Systems  Musculoskeletal: Positive for neck pain.  All other systems reviewed and are negative.      Objective:   Physical Exam Vitals reviewed.  Constitutional:      General: She is not in acute distress.    Appearance: She is well-developed.  HENT:     Head: Normocephalic and atraumatic.     Right Ear: Tympanic membrane normal.     Left Ear: Tympanic membrane normal.  Eyes:     Pupils: Pupils are equal, round, and reactive to light.  Neck:     Thyroid: No thyromegaly.  Cardiovascular:     Rate and Rhythm: Normal rate and regular rhythm.     Heart sounds: Normal heart sounds. No murmur heard.   Pulmonary:     Effort: Pulmonary effort is normal. No respiratory distress.     Breath sounds: Normal breath sounds. No wheezing.  Abdominal:     General: Bowel sounds are normal. There  is no distension.     Palpations: Abdomen is soft.     Tenderness: There is no abdominal tenderness.  Musculoskeletal:        General: Tenderness present.     Cervical back: Normal range of motion and neck supple.     Comments: Pain in bilateral shoulders with abduction   Skin:    General: Skin is warm and dry.  Neurological:     Mental Status: She is alert and oriented to person, place, and time.     Cranial Nerves: No cranial nerve deficit.     Deep Tendon Reflexes: Reflexes are normal and symmetric.  Psychiatric:        Behavior: Behavior normal.        Thought Content: Thought content normal.        Judgment: Judgment normal.       BP 126/84   Pulse (!) 120   Temp 98.2 F (36.8 C) (Temporal)   Ht 5\' 6"  (1.676 m)   Wt 121 lb 12.8 oz (55.2 kg)   BMI 19.66 kg/m      Assessment & Plan:  Michelle Ortega comes in today with chief complaint of Follow-up   Diagnosis and orders addressed:  1. Tachycardia Avoid caffeine Referral to Cardiologists   - Ambulatory  referral to Cardiology  2. Acute pain of both shoulders Continue diclofenac 75 mg BID Ultram as needed  - Ambulatory referral to Orthopedic Surgery  3. GAD (generalized anxiety disorder) Continue Cymbalta and increase to 60 mg   4. Moderate episode of recurrent major depressive disorder (HCC)    Jannifer Rodney, FNP

## 2021-03-18 ENCOUNTER — Other Ambulatory Visit: Payer: Self-pay | Admitting: Family

## 2021-03-18 ENCOUNTER — Encounter: Payer: Self-pay | Admitting: Orthopedic Surgery

## 2021-03-18 DIAGNOSIS — F331 Major depressive disorder, recurrent, moderate: Secondary | ICD-10-CM

## 2021-03-18 DIAGNOSIS — F411 Generalized anxiety disorder: Secondary | ICD-10-CM

## 2021-03-24 ENCOUNTER — Other Ambulatory Visit: Payer: Self-pay

## 2021-03-24 ENCOUNTER — Encounter: Payer: Self-pay | Admitting: Orthopaedic Surgery

## 2021-03-24 ENCOUNTER — Ambulatory Visit (INDEPENDENT_AMBULATORY_CARE_PROVIDER_SITE_OTHER): Payer: Self-pay | Admitting: Orthopaedic Surgery

## 2021-03-24 VITALS — BP 122/89 | HR 123 | Ht 66.0 in | Wt 118.0 lb

## 2021-03-24 DIAGNOSIS — G8929 Other chronic pain: Secondary | ICD-10-CM

## 2021-03-24 DIAGNOSIS — M25511 Pain in right shoulder: Secondary | ICD-10-CM

## 2021-03-24 DIAGNOSIS — M25512 Pain in left shoulder: Secondary | ICD-10-CM

## 2021-03-24 NOTE — Progress Notes (Signed)
Subjective:    Patient ID: Michelle Ortega, female    DOB: November 03, 1970, 50 y.o.   MRN: [id number]  HPI She has history of bilateral shoulder pain, worse on the left for many months.  She has no trauma.  She has some swelling of the anterior left shoulder. She has no redness. She has pain with overhead use and trying to put on bra.  She has no redness, no numbness. She has neck pain at times also.  She has been seen at [facility].  I have reviewed the notes and the X-rays.  I have independently reviewed and interpreted x-rays of this patient done at another site by another physician or qualified health professional.  She is taking diclofenac for about three weeks and it helps.   Review of Systems  Constitutional: Positive for activity change.  Musculoskeletal: Positive for arthralgias and joint swelling.  All other systems reviewed and are negative.  For Review of Systems, all other systems reviewed and are negative.  The following is a summary of the past history medically, past history surgically, known current medicines, social history and family history.  This information is gathered electronically by the computer from prior information and documentation.  I review this each visit and have found including this information at this point in the chart is beneficial and informative.   Past Medical History:  Diagnosis Date  . Depression   . Thyroid disease     Past Surgical History:  Procedure Laterality Date  . BREAST LUMPECTOMY    . KNEE SURGERY Left   . TONSILLECTOMY      Current Outpatient Medications on File Prior to Visit  Medication Sig Dispense Refill  . diclofenac (VOLTAREN) 75 MG EC tablet Take 1 tablet (75 mg total) by mouth 2 (two) times daily. 60 tablet 2  . DULoxetine (CYMBALTA) 60 MG capsule Take 1 capsule (60 mg total) by mouth daily. 90 capsule 1  . levothyroxine (SYNTHROID) 50 MCG tablet Take 1 tablet (50 mcg total) by mouth daily. 30 tablet 11  .  traMADol (ULTRAM) 50 MG tablet Take 50-100 mg by mouth every 12 (twelve) hours as needed (pain).    . Vitamin D, Ergocalciferol, (DRISDOL) 1.25 MG (50000 UNIT) CAPS capsule Take 1 capsule (50,000 Units total) by mouth every 7 (seven) days. 12 capsule 3   No current facility-administered medications on file prior to visit.    Social History   Socioeconomic History  . Marital status: Divorced    Spouse name: Not on file  . Number of children: Not on file  . Years of education: Not on file  . Highest education level: Not on file  Occupational History  . Not on file  Tobacco Use  . Smoking status: Current Every Day Smoker    Types: Cigarettes    Start date: 06/06/1988  . Smokeless tobacco: Never Used  Substance and Sexual Activity  . Alcohol use: Yes    Comment: rare  . Drug use: No  . Sexual activity: Not on file  Other Topics Concern  . Not on file  Social History Narrative  . Not on file   Social Determinants of Health   Financial Resource Strain: Not on file  Food Insecurity: Not on file  Transportation Needs: Not on file  Physical Activity: Not on file  Stress: Not on file  Social Connections: Not on file  Intimate Partner Violence: Not on file    Family History  Problem Relation Age of Onset  .  Hypothyroidism Mother   . Diabetes Father   . Diabetes Sister   . Diabetes Brother     BP 122/89   Pulse (!) 123   Ht 5\' 6"  (1.676 m)   Wt 118 lb (53.5 kg)   BMI 19.05 kg/m   Body mass index is 19.05 kg/m.     Objective:   Physical Exam Vitals and nursing note reviewed. Exam conducted with a chaperone present.  Constitutional:      Appearance: She is well-developed.  HENT:     Head: Normocephalic and atraumatic.  Eyes:     Conjunctiva/sclera: Conjunctivae normal.     Pupils: Pupils are equal, round, and reactive to light.  Cardiovascular:     Rate and Rhythm: Normal rate and regular rhythm.  Pulmonary:     Effort: Pulmonary effort is normal.   Abdominal:     Palpations: Abdomen is soft.  Musculoskeletal:       Arms:     Cervical back: Normal range of motion and neck supple.  Skin:    General: Skin is warm and dry.  Neurological:     Mental Status: She is alert and oriented to person, place, and time.     Cranial Nerves: No cranial nerve deficit.     Motor: No abnormal muscle tone.     Coordination: Coordination normal.     Deep Tendon Reflexes: Reflexes are normal and symmetric. Reflexes normal.  Psychiatric:        Behavior: Behavior normal.        Thought Content: Thought content normal.        Judgment: Judgment normal.           Assessment & Plan:   Encounter Diagnosis  Name Primary?  . Chronic left shoulder pain Yes   PROCEDURE NOTE:  The patient request injection, verbal consent was obtained.  The left shoulder was prepped appropriately after time out was performed.   Sterile technique was observed and injection of 1 cc of DepoMedrol 40mg  with several cc's of plain xylocaine. Anesthesia was provided by ethyl chloride and a 20-gauge needle was used to inject the shoulder area. A posterior approach was used.  The injection was tolerated well.  A band aid dressing was applied.  The patient was advised to apply ice later today and tomorrow to the injection sight as needed.   She is to continue the diclofenac.  Sheet of exercises given her.  Return in three weeks.  Call if any problem.  Precautions discussed.   Electronically Signed , MD 6/7/20222:25 PM

## 2021-04-14 ENCOUNTER — Encounter: Payer: Self-pay | Admitting: Orthopaedic Surgery

## 2021-04-14 ENCOUNTER — Ambulatory Visit (INDEPENDENT_AMBULATORY_CARE_PROVIDER_SITE_OTHER): Payer: Self-pay | Admitting: Orthopaedic Surgery

## 2021-04-14 VITALS — BP 131/89 | HR 108 | Ht 66.0 in | Wt 120.0 lb

## 2021-04-14 DIAGNOSIS — G8929 Other chronic pain: Secondary | ICD-10-CM

## 2021-04-14 DIAGNOSIS — M25511 Pain in right shoulder: Secondary | ICD-10-CM

## 2021-04-14 NOTE — Patient Instructions (Signed)
Steps to Quit Smoking Smoking tobacco is the leading cause of preventable death. It can affect almost every organ in the body. Smoking puts you and people around you at risk for many serious, long-lasting (chronic) diseases. Quitting smoking can be hard, but it is one of the best things that you can do for your health. It is never too late to quit. How do I get ready to quit? When you decide to quit smoking, make a plan to help you succeed. Before you quit: Pick a date to quit. Set a date within the next 2 weeks to give you time to prepare. Write down the reasons why you are quitting. Keep this list in places where you will see it often. Tell your family, friends, and co-workers that you are quitting. Their support is important. Talk with your doctor about the choices that may help you quit. Find out if your health insurance will pay for these treatments. Know the people, places, things, and activities that make you want to smoke (triggers). Avoid them. What first steps can I take to quit smoking? Throw away all cigarettes at home, at work, and in your car. Throw away the things that you use when you smoke, such as ashtrays and lighters. Clean your car. Make sure to empty the ashtray. Clean your home, including curtains and carpets. What can I do to help me quit smoking? Talk with your doctor about taking medicines and seeing a counselor at the same time. You are more likely to succeed when you do both. If you are pregnant or breastfeeding, talk with your doctor about counseling or other ways to quit smoking. Do not take medicine to help you quit smoking unless your doctor tells you to do so. To quit smoking: Quit right away Quit smoking totally, instead of slowly cutting back on how much you smoke over a period of time. Go to counseling. You are more likely to quit if you go to counseling sessions regularly. Take medicine You may take medicines to help you quit. Some medicines need a  prescription, and some you can buy over-the-counter. Some medicines may contain a drug called nicotine to replace the nicotine in cigarettes. Medicines may: Help you to stop having the desire to smoke (cravings). Help to stop the problems that come when you stop smoking (withdrawal symptoms). Your doctor may ask you to use: Nicotine patches, gum, or lozenges. Nicotine inhalers or sprays. Non-nicotine medicine that is taken by mouth. Find resources Find resources and other ways to help you quit smoking and remain smoke-free after you quit. These resources are most helpful when you use them often. They include: Online chats with a counselor. Phone quitlines. Printed self-help materials. Support groups or group counseling. Text messaging programs. Mobile phone apps. Use apps on your mobile phone or tablet that can help you stick to your quit plan. There are many free apps for mobile phones and tablets as well as websites. Examples include Quit Guide from the CDC and smokefree.gov  What things can I do to make it easier to quit?  Talk to your family and friends. Ask them to support and encourage you. Call a phone quitline (1-800-QUIT-NOW), reach out to support groups, or work with a counselor. Ask people who smoke to not smoke around you. Avoid places that make you want to smoke, such as: Bars. Parties. Smoke-break areas at work. Spend time with people who do not smoke. Lower the stress in your life. Stress can make you want to   smoke. Try these things to help your stress: Getting regular exercise. Doing deep-breathing exercises. Doing yoga. Meditating. Doing a body scan. To do this, close your eyes, focus on one area of your body at a time from head to toe. Notice which parts of your body are tense. Try to relax the muscles in those areas. How will I feel when I quit smoking? Day 1 to 3 weeks Within the first 24 hours, you may start to have some problems that come from quitting tobacco.  These problems are very bad 2-3 days after you quit, but they do not often last for more than 2-3 weeks. You may get these symptoms: Mood swings. Feeling restless, nervous, angry, or annoyed. Trouble concentrating. Dizziness. Strong desire for high-sugar foods and nicotine. Weight gain. Trouble pooping (constipation). Feeling like you may vomit (nausea). Coughing or a sore throat. Changes in how the medicines that you take for other issues work in your body. Depression. Trouble sleeping (insomnia). Week 3 and afterward After the first 2-3 weeks of quitting, you may start to notice more positive results, such as: Better sense of smell and taste. Less coughing and sore throat. Slower heart rate. Lower blood pressure. Clearer skin. Better breathing. Fewer sick days. Quitting smoking can be hard. Do not give up if you fail the first time. Some people need to try a few times before they succeed. Do your best to stick to your quit plan, and talk with your doctor if you have any questions or concerns. Summary Smoking tobacco is the leading cause of preventable death. Quitting smoking can be hard, but it is one of the best things that you can do for your health. When you decide to quit smoking, make a plan to help you succeed. Quit smoking right away, not slowly over a period of time. When you start quitting, seek help from your doctor, family, or friends. This information is not intended to replace advice given to you by your health care provider. Make sure you discuss any questions you have with your health care provider. Document Revised: 06/29/2019 Document Reviewed: 12/23/2018 Elsevier Patient Education  2022 Elsevier Inc.  

## 2021-04-14 NOTE — Progress Notes (Signed)
PROCEDURE NOTE:  The patient request injection, verbal consent was obtained.  The right shoulder was prepped appropriately after time out was performed.   Sterile technique was observed and injection of 1 cc of DepoMedrol 40mg  with several cc's of plain xylocaine. Anesthesia was provided by ethyl chloride and a 20-gauge needle was used to inject the shoulder area. A posterior approach was used.  The injection was tolerated well.  A band aid dressing was applied.  The patient was advised to apply ice later today and tomorrow to the injection sight as needed.  Return in three weeks.  The left shoulder is much improved after the injection three weeks ago.  Encounter Diagnosis  Name Primary?   Chronic right shoulder pain Yes   Call if any problem.  Precautions discussed.  Electronically Signed , MD 6/28/20222:23 PM

## 2021-05-05 ENCOUNTER — Ambulatory Visit: Payer: Self-pay | Admitting: Orthopaedic Surgery

## 2021-07-21 DIAGNOSIS — R000 Tachycardia, unspecified: Secondary | ICD-10-CM | POA: Insufficient documentation

## 2021-07-21 NOTE — Progress Notes (Signed)
Cardiology Office Note   Date:  07/22/2021   ID:  Michelle Ortega, Michelle Ortega July 12, 1971, MRN [id number]  PCP:  Junie Spencer, FNP  Cardiologist:   None Referring:  Junie Spencer, FNP  Chief Complaint  Patient presents with   Tachycardia      History of Present Illness: Michelle Ortega is a 50 y.o. female who is referred for evaluation of tachycardia.   She is referred by Veatrice Bourbon is been noted to have this on recent vital checks fairly consistently.  I see 1 that was in the 120s.  She has not had any past cardiac history other than wearing a monitor maybe 10 years ago for palpitations.  She feels that sometimes but not all the time.  She does have some dizziness sometimes upon standing or changing positions or when she has been standing.  She has had some presyncope with most recent episode about 2 to 3 weeks.  Things like they are spinning.  This is not always reproducible with change in position.   She gets some discomfort under her lower ribs when she needs deep sometimes.  She has some shortness of breath.  She has been started on Cymbalta recently and restarted on thyroid medicines which she stopped taking.  She chronically smokes cigarettes.  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    Past Medical History:  Diagnosis Date   Depression    Thyroid disease     Past Surgical History:  Procedure Laterality Date   BREAST LUMPECTOMY     KNEE SURGERY Left    TONSILLECTOMY       Current Outpatient Medications  Medication Sig Dispense Refill   diclofenac (VOLTAREN) 75 MG EC tablet Take 1 tablet (75 mg total) by mouth 2 (two) times daily. 60 tablet 2   DULoxetine (CYMBALTA) 60 MG capsule Take 1 capsule (60 mg total) by mouth daily. 90 capsule 1   levothyroxine (SYNTHROID) 50 MCG tablet Take 1 tablet (50 mcg total) by mouth daily. 30 tablet 11   Vitamin D, Ergocalciferol, (DRISDOL)  1.25 MG (50000 UNIT) CAPS capsule Take 1 capsule (50,000 Units total) by mouth every 7 (seven) days. 12 capsule 3   traMADol (ULTRAM) 50 MG tablet Take 50-100 mg by mouth every 12 (twelve) hours as needed (pain). (Patient not taking: Reported on 07/22/2021)     No current facility-administered medications for this visit.    Allergies:   Erythromycin and Penicillins    Social History:  The patient  reports that she has been smoking cigarettes. She started smoking about 33 years ago. She has never used smokeless tobacco. She reports current alcohol use. She reports that she does not use drugs.   Family History:  The patient's family history includes Diabetes in her brother and father; Diabetes (age of onset: 59) in her sister; Hypothyroidism in her mother.    ROS:  Please see the history of present illness.   Otherwise, review of systems are positive joint pains, 30 pound weight loss over the last several months unintentional.   All other systems are reviewed and negative.    PHYSICAL EXAM: VS:  BP 102/78   Pulse (!) 107   Ht 5' 6.25" (1.683 m)   Wt 112 lb (50.8 kg)   BMI 17.94 kg/m  , BMI Body mass index is 17.94 kg/m. GENERAL:  Well  appearing HEENT:  Pupils equal round and reactive, fundi not visualized, oral mucosa unremarkable NECK:  No jugular venous distention, waveform within normal limits, carotid upstroke brisk and symmetric, no bruits, no thyromegaly LYMPHATICS:  No cervical, inguinal adenopathy LUNGS:  Clear to auscultation bilaterally BACK:  No CVA tenderness CHEST:  Unremarkable HEART:  PMI not displaced or sustained,S1 and S2 within normal limits, no S3, no S4, no clicks, no rubs, no murmurs ABD:  Flat, positive bowel sounds normal in frequency in pitch, no bruits, no rebound, no guarding, no midline pulsatile mass, no hepatomegaly, no splenomegaly EXT:  2 plus pulses throughout, no edema, no cyanosis no clubbing SKIN:  No rashes no nodules NEURO:  Cranial nerves II  through XII grossly intact, motor grossly intact throughout PSYCH:  Cognitively intact, oriented to person place and time    EKG:  EKG is ordered today. The ekg ordered today demonstrates sinus tachycardia, rate 107, axis within normal limits, intervals within normal limits, no acute ST-T wave changes.   Recent Labs: 02/19/2021: ALT 6; BUN 10; Creatinine, Ser 0.69; Hemoglobin 13.6; Platelets 433; Potassium 3.9; Sodium 138; TSH 8.760    Lipid Panel    Component Value Date/Time   CHOL 183 02/19/2021 1532   TRIG 108 02/19/2021 1532   HDL 48 02/19/2021 1532   CHOLHDL 3.8 02/19/2021 1532   LDLCALC 115 (H) 02/19/2021 1532      Wt Readings from Last 3 Encounters:  07/22/21 112 lb (50.8 kg)  04/14/21 120 lb (54.4 kg)  03/24/21 118 lb (53.5 kg)      Other studies Reviewed: Additional studies/ records that were reviewed today include: Labs. Review of the above records demonstrates:  Please see elsewhere in the note.     ASSESSMENT AND PLAN:  TACHYCARDIA: Her thyroid is being regulated with a TSH of 8.76 earlier this year.  She is not anemic.  I am going to have her wear a 4-week monitor to further evaluate this and the near syncope.  Of note in the office today she was orthostatic. We talked about salt loading and unloading and precautions around standing up quickly.   SYNCOPE: She is have presyncope.  Again this is evaluating as above.  TOBACCO ABUSE: She is cutting back and understands the need to quit smoking completely.   Current medicines are reviewed at length with the patient today.  The patient does not have concerns regarding medicines.  The following changes have been made:  no change  Labs/ tests ordered today include: None  Orders Placed This Encounter  Procedures   CARDIAC EVENT MONITOR   EKG 12-Lead      Disposition:   FU with me after the results of the monitor   Signed, Rollene Rotunda, MD  07/22/2021 3:39 PM    [facility]

## 2021-07-22 ENCOUNTER — Ambulatory Visit (INDEPENDENT_AMBULATORY_CARE_PROVIDER_SITE_OTHER): Payer: Self-pay | Admitting: Cardiology

## 2021-07-22 ENCOUNTER — Encounter: Payer: Self-pay | Admitting: Cardiology

## 2021-07-22 ENCOUNTER — Other Ambulatory Visit: Payer: Self-pay

## 2021-07-22 VITALS — BP 102/78 | HR 107 | Ht 66.25 in | Wt 112.0 lb

## 2021-07-22 DIAGNOSIS — R000 Tachycardia, unspecified: Secondary | ICD-10-CM

## 2021-07-22 NOTE — Patient Instructions (Signed)
Medication Instructions:  The current medical regimen is effective;  continue present plan and medications.  *If you need a refill on your cardiac medications before your next appointment, please call your pharmacy*  Testing/Procedures: Your physician has recommended that you wear an event monitor for 30 days. Event monitors are medical devices that record the heart's electrical activity. Doctors most often [place] these monitors to diagnose arrhythmias. Arrhythmias are problems with the speed or rhythm of the heartbeat. The monitor is a small, portable device. You can wear one while you do your normal daily activities. This is usually used to diagnose what is causing palpitations/syncope (passing out).  Follow-Up: At [facility], you and your health needs are our priority.  As part of our continuing mission to provide you with exceptional heart care, we have created designated Provider Care Teams.  These Care Teams include your primary Cardiologist (physician) and Advanced Practice Providers (APPs -  Physician Assistants and Nurse Practitioners) who all work together to provide you with the care you need, when you need it.  We recommend signing up for the patient portal called "MyChart".  Sign up information is provided on this After Visit Summary.  MyChart is used to connect with patients for Virtual Visits (Telemedicine).  Patients are able to view lab/test results, encounter notes, upcoming appointments, etc.  Non-urgent messages can be sent to your provider as well.   To learn more about what you can do with MyChart, go to [email or web address].    Your next appointment:   2 month(s)  The format for your next appointment:   In Person  Provider:   Rollene Rotunda, MD    Thank you for choosing [facility]!!    Postural Orthostatic Tachycardia Syndrome Postural orthostatic tachycardia syndrome (POTS) is a group of symptoms that occur when a person stands up after lying down.  POTS occurs when less blood than normal flows to the body when you stand up. The reduced blood flow to the body makes the heart beat rapidly. POTS may be associated with another medical condition, or it may occur on its own. What are the causes? The cause of this condition is not known, but many conditions and diseases are associated with it. What increases the risk? This condition is more likely to develop in: Women 69-63 years old. Women who are pregnant. Women who are in their period (menstruating). People who have certain conditions, such as: Infection from a virus. Attacks of healthy organs by the body's immunity (autoimmune disease). Losing a lot of red blood cells (anemia). Losing too much water in the body (dehydration). An overactive thyroid (hyperthyroidism). People who take certain medicines. People who have had a major injury. People who have had surgery. What are the signs or symptoms? The most common symptom of this condition is light-headedness when one stands from a lying or sitting position. Other symptoms may include: Feeling a rapid increase in the heartbeat (tachycardia) within 10 minutes of standing up. Fainting. Weakness. Confusion. Trembling. Shortness of breath. Sweating or flushing. Headache. Chest pain. Breathing that is deeper and faster than normal (hyperventilation). Nausea. Anxiety. Symptoms may be worse in the morning, and they may be relieved by lying down. How is this diagnosed? This condition is diagnosed based on: Your symptoms. Your medical history. A physical exam. Checking your heart rate when you are lying down and after you stand up. Checking your blood pressure when you go from lying down to standing up. Blood tests to measure hormones that change  with blood pressure. The blood tests will be done when you are lying down and when you are standing up. You may have other tests to check for conditions or diseases that are associated with  POTS. How is this treated? Treatment for this condition depends on how severe your symptoms are and whether you have any conditions or diseases that are associated with POTS. Treatment may involve: Treating any conditions or diseases that are associated with POTS. Drinking two glasses of water before getting up from a lying position. Eating more salt (sodium). Taking medicine to control blood pressure and heart rate (beta-blocker). Avoiding certain medicines. Starting an exercise program under the supervision of a health care provider. Follow these instructions at home: Medicines Take over-the-counter and prescription medicines only as told by your health care provider. Let your health care provider know about all prescription or over-the-counter medicines. These include herbs, vitamins, and supplements. You may need to stop or adjust some medicines if they cause this condition. Talk with your health care provider before starting any new medicines. Eating and drinking  Drink enough fluid to keep your urine pale yellow. If told by your health care provider, drink two glasses of water before getting up from a lying position. Follow instructions from your health care provider about how much sodium you should eat. Avoid heavy meals. Eat several small meals a day instead of a few large meals. General instructions Do an aerobic exercise for 20 minutes a day, at least 3 days a week. Ask your health care provider what kinds of exercise are safe for you. Do not use any products that contain nicotine or tobacco, such as cigarettes and e-cigarettes. These can interfere with blood flow. If you need help quitting, ask your health care provider. Keep all follow-up visits as told by your health care provider. This is important. Contact a health care provider if: Your symptoms do not improve after treatment. Your symptoms get worse. You develop new symptoms. Get help right away if: You have chest  pain. You have difficulty breathing. You have fainting episodes. These symptoms may represent a serious problem that is an emergency. Do not wait to see if the symptoms will go away. Get medical help right away. Call your local emergency services (911 in the [place] Do not drive yourself to the hospital. Summary POTS is a condition that can cause light-headedness, fainting, and palpitations when you go from a sitting or lying position to a standing position. It occurs when less blood than normal flows to the body when you stand up. Treatment for this condition includes treating any underlying conditions, drinking plenty of water, stopping or changing some medicines, or starting an exercise program. Get help right away if you have chest pain, difficulty breathing, or fainting episodes. These may represent a serious problem that is an emergency. This information is not intended to replace advice given to you by your health care provider. Make sure you discuss any questions you have with your health care provider. Document Revised: 12/24/2020 Document Reviewed: 11/15/2017 Elsevier Patient Education  2022 [employer].

## 2021-07-27 ENCOUNTER — Other Ambulatory Visit: Payer: Self-pay | Admitting: Family Medicine

## 2021-07-27 ENCOUNTER — Other Ambulatory Visit: Payer: Self-pay | Admitting: Family

## 2021-07-27 ENCOUNTER — Telehealth: Payer: Self-pay | Admitting: Family

## 2021-07-27 DIAGNOSIS — F411 Generalized anxiety disorder: Secondary | ICD-10-CM

## 2021-07-27 DIAGNOSIS — M5412 Radiculopathy, cervical region: Secondary | ICD-10-CM

## 2021-07-27 DIAGNOSIS — M25511 Pain in right shoulder: Secondary | ICD-10-CM

## 2021-07-27 DIAGNOSIS — F331 Major depressive disorder, recurrent, moderate: Secondary | ICD-10-CM

## 2021-07-27 DIAGNOSIS — M542 Cervicalgia: Secondary | ICD-10-CM

## 2021-07-27 MED ORDER — DULOXETINE HCL 60 MG PO CPEP: 60.0000 mg | ORAL_CAPSULE | Freq: Every day | ORAL | 1 refills | Status: DC

## 2021-07-27 NOTE — Telephone Encounter (Signed)
Spoke with patient refill sent

## 2021-07-29 ENCOUNTER — Ambulatory Visit (INDEPENDENT_AMBULATORY_CARE_PROVIDER_SITE_OTHER): Payer: Self-pay

## 2021-07-29 DIAGNOSIS — R000 Tachycardia, unspecified: Secondary | ICD-10-CM

## 2021-08-02 ENCOUNTER — Telehealth: Payer: Self-pay | Admitting: Student

## 2021-08-02 NOTE — Telephone Encounter (Signed)
   Cardiac Monitor Alert  Date of alert:  08/02/2021   Patient Name: Michelle Ortega  DOB: 07/16/1971  MRN: [id number]   [facility] Cardiologist: Rollene Rotunda, MD  [facility] EP:  None    Monitor Information: Cardiac Event Monitor [[employer]]  Reason: Tachycardia and Presyncope Ordering provider: Dr. Antoine Poche  Alert [employer] called to report she had experienced an episode of atrial flutter with heart rate in the 130's earlier today and had gone back into sinus tachycardia.  The patient was contacted today. She is asymptomatic. Says she might have been having a verbal altercation with a family member at that time. She denies any associated dizziness or presyncope.  Will have monitor strips faxed to the office for review to determine if she actually had atrial flutter or sinus/atrial tachycardia. Her CHA2DS2-VASc Score at this time would only be 1 based off female sex so will await MD to review actual strips to confirm her rhythm and decide on further medication adjustments at that time.  Ellsworth Lennox, PA-C  08/02/2021 2:33 PM

## 2021-09-14 DIAGNOSIS — I480 Paroxysmal atrial fibrillation: Secondary | ICD-10-CM | POA: Insufficient documentation

## 2021-09-14 DIAGNOSIS — Z720 Tobacco use: Secondary | ICD-10-CM | POA: Insufficient documentation

## 2021-09-14 NOTE — Progress Notes (Signed)
Cardiology Office Note   Date:  09/16/2021   ID:  Norman, Bier April 22, 1971, MRN [id number]  PCP:  Junie Spencer, FNP  Cardiologist:   Rollene Rotunda, MD Referring:  Junie Spencer, FNP  Chief Complaint  Patient presents with   Dizziness       History of Present Illness: Michelle Ortega is a 50 y.o. female who is referred for evaluation of tachycardia.   She is referred by Junie Spencer, FNP.     I had her wear a monitor.  She did have runs of atrial fib.  I reviewed this with her today.  There were very brief episodes of what appears to be atrial fibrillation although I could absolutely exclude an atrial tachycardia.  However, arrhythmias did not necessarily correlate with symptoms.  She has a lot of lightheadedness which happened with normal sinus rhythm or sinus tach or during PACs.  She did not necessarily feel the runs of more irregular rhythm.  She has had multiple episodes still of presyncope.  These are episodes of lightheadedness that come on her when she might be just doing something like standing at the sink.  She cannot bring these on.  She has not actually lost consciousness.  She is not having any new chest pressure, neck or arm discomfort.  She is not having any weight gain or swelling.  Past Medical History:  Diagnosis Date   Depression    Thyroid disease     Past Surgical History:  Procedure Laterality Date   BREAST LUMPECTOMY     KNEE SURGERY Left    TONSILLECTOMY       Current Outpatient Medications  Medication Sig Dispense Refill   aspirin EC 81 MG tablet Take 1 tablet (81 mg total) by mouth daily. Swallow whole. 90 tablet 3   diclofenac (VOLTAREN) 75 MG EC tablet Take 1 tablet (75 mg total) by mouth 2 (two) times daily. (NEEDS TO BE SEEN BEFORE NEXT REFILL) 60 tablet 0   DULoxetine (CYMBALTA) 60 MG capsule Take 1 capsule (60 mg total) by mouth daily. 90 capsule 1   levothyroxine (SYNTHROID) 50 MCG tablet Take 1 tablet (50 mcg total) by mouth  daily. 30 tablet 11   metoprolol succinate (TOPROL XL) 25 MG 24 hr tablet Take 1 tablet (25 mg total) by mouth daily. 90 tablet 3   Vitamin D, Ergocalciferol, (DRISDOL) 1.25 MG (50000 UNIT) CAPS capsule Take 1 capsule (50,000 Units total) by mouth every 7 (seven) days. 12 capsule 3   traMADol (ULTRAM) 50 MG tablet Take 50-100 mg by mouth every 12 (twelve) hours as needed (pain). (Patient not taking: Reported on 07/22/2021)     No current facility-administered medications for this visit.    Allergies:   Erythromycin and Penicillins    ROS:  Please see the history of present illness.   Otherwise, review of systems are positive for none .   All other systems are reviewed and negative.    PHYSICAL EXAM: VS:  BP 122/80   Pulse (!) 110   Ht 5' 6.25" (1.683 m)   Wt 115 lb (52.2 kg)   BMI 18.42 kg/m  , BMI Body mass index is 18.42 kg/m. GENERAL:  Well appearing NECK:  No jugular venous distention, waveform within normal limits, carotid upstroke brisk and symmetric, no bruits, no thyromegaly LUNGS:  Clear to auscultation bilaterally CHEST:  Unremarkable HEART:  PMI not displaced or sustained,S1 and S2 within normal limits, no S3, no  S4, no clicks, no rubs, no murmurs ABD:  Flat, positive bowel sounds normal in frequency in pitch, no bruits, no rebound, no guarding, no midline pulsatile mass, no hepatomegaly, no splenomegaly EXT:  2 plus pulses throughout, no edema, no cyanosis no clubbing   EKG:  EKG is  ordered today. The ekg ordered today demonstrates sinus tachycardia, rate 107, axis within normal limits, intervals within normal limits, no acute ST-T wave changes.   Recent Labs: 02/19/2021: ALT 6; BUN 10; Creatinine, Ser 0.69; Hemoglobin 13.6; Platelets 433; Potassium 3.9; Sodium 138; TSH 8.760    Lipid Panel    Component Value Date/Time   CHOL 183 02/19/2021 1532   TRIG 108 02/19/2021 1532   HDL 48 02/19/2021 1532   CHOLHDL 3.8 02/19/2021 1532   LDLCALC 115 (H) 02/19/2021 1532       Wt Readings from Last 3 Encounters:  09/16/21 115 lb (52.2 kg)  07/22/21 112 lb (50.8 kg)  04/14/21 120 lb (54.4 kg)      Other studies Reviewed: Additional studies/ records that were reviewed today include: Monitor. Review of the above records demonstrates:  Please see elsewhere in the note.     ASSESSMENT AND PLAN:  PAF:    Ms. RADWA FELLINGER has a CHA2DS2 - VASc score of 1.  She is getting her thyroid meds regulated.  Her TSH most recently was slightly elevated.  She does not need long-term anticoagulation with the brevity of her events and her low embolic risk.  She will start a baby aspirin.  I will also have her start Toprol-XL 25 mg daily to see if this helps with any of the episodes of lightheadedness which may or may not be related.  SYNCOPE: She has had no frank syncope.  She has a lightheaded episodes as above.  This will be managed as mentioned.   TOBACCO ABUSE:   We again talked about the need to stop smoking.   Current medicines are reviewed at length with the patient today.  The patient does not have concerns regarding medicines.  The following changes have been made:  as above  Labs/ tests ordered today include:   Orders Placed This Encounter  Procedures   EKG 12-Lead       Disposition:   FU with me in six months.    Signed, Minus Breeding, MD  09/16/2021 4:30 PM    [place]

## 2021-09-16 ENCOUNTER — Ambulatory Visit (INDEPENDENT_AMBULATORY_CARE_PROVIDER_SITE_OTHER): Payer: Self-pay | Admitting: Cardiology

## 2021-09-16 ENCOUNTER — Other Ambulatory Visit: Payer: Self-pay

## 2021-09-16 ENCOUNTER — Encounter: Payer: Self-pay | Admitting: Cardiology

## 2021-09-16 VITALS — BP 122/80 | HR 110 | Ht 66.25 in | Wt 115.0 lb

## 2021-09-16 DIAGNOSIS — R55 Syncope and collapse: Secondary | ICD-10-CM

## 2021-09-16 DIAGNOSIS — I480 Paroxysmal atrial fibrillation: Secondary | ICD-10-CM

## 2021-09-16 DIAGNOSIS — Z720 Tobacco use: Secondary | ICD-10-CM

## 2021-09-16 MED ORDER — METOPROLOL SUCCINATE ER 25 MG PO TB24: 25.0000 mg | ORAL_TABLET | Freq: Every day | ORAL | 3 refills | Status: DC

## 2021-09-16 MED ORDER — ASPIRIN EC 81 MG PO TBEC: 81.0000 mg | DELAYED_RELEASE_TABLET | Freq: Every day | ORAL | 3 refills | Status: AC

## 2021-09-16 NOTE — Patient Instructions (Signed)
Medication Instructions:  Please start Aspirin 81 mg a day. Start Metoprolol Succinate 25 mg a day. Continue all other medications as listed.  *If you need a refill on your cardiac medications before your next appointment, please call your pharmacy*  Follow-Up: At [facility], you and your health needs are our priority.  As part of our continuing mission to provide you with exceptional heart care, we have created designated Provider Care Teams.  These Care Teams include your primary Cardiologist (physician) and Advanced Practice Providers (APPs -  Physician Assistants and Nurse Practitioners) who all work together to provide you with the care you need, when you need it.  We recommend signing up for the patient portal called "MyChart".  Sign up information is provided on this After Visit Summary.  MyChart is used to connect with patients for Virtual Visits (Telemedicine).  Patients are able to view lab/test results, encounter notes, upcoming appointments, etc.  Non-urgent messages can be sent to your provider as well.   To learn more about what you can do with MyChart, go to [email or web address].    Your next appointment:   6 month(s)  The format for your next appointment:   In Person  Provider:   Minus Breeding, MD    Thank you for choosing [facility]!!    Managing the Challenge of Quitting Smoking Quitting smoking is a physical and mental challenge. You will face cravings, withdrawal symptoms, and temptation. Before quitting, work with your health care provider to make a plan that can help you manage quitting. Preparation can help you quit and keep you from giving in. How to manage lifestyle changes Managing stress Stress can make you want to smoke, and wanting to smoke may cause stress. It is important to find ways to manage your stress. You might try some of the following: Practice relaxation techniques. Breathe slowly and deeply, in through your nose and out  through your mouth. Listen to music. Soak in a bath or take a shower. Imagine a peaceful place or vacation. Get some support. Talk with family or friends about your stress. Join a support group. Talk with a counselor or therapist. Get some physical activity. Go for a walk, run, or bike ride. Play a favorite sport. Practice yoga.  Medicines Talk with your health care provider about medicines that might help you deal with cravings and make quitting easier for you. Relationships Social situations can be difficult when you are quitting smoking. To manage this, you can: Avoid parties and other social situations where people might be smoking. Avoid alcohol. Leave right away if you have the urge to smoke. Explain to your family and friends that you are quitting smoking. Ask for support and let them know you might be a bit grumpy. Plan activities where smoking is not an option. General instructions Be aware that many people gain weight after they quit smoking. However, not everyone does. To keep from gaining weight, have a plan in place before you quit and stick to the plan after you quit. Your plan should include: Having healthy snacks. When you have a craving, it may help to: Eat popcorn, carrots, celery, or other cut vegetables. Chew sugar-free gum. Changing how you eat. Eat small portion sizes at meals. Eat 4-6 small meals throughout the day instead of 1-2 large meals a day. Be mindful when you eat. Do not watch television or do other things that might distract you as you eat. Exercising regularly. Make time to exercise each day. If  you do not have time for a long workout, do short bouts of exercise for 5-10 minutes several times a day. Do some form of strengthening exercise, such as weight lifting. Do some exercise that gets your heart beating and causes you to breathe deeply, such as walking fast, running, swimming, or biking. This is very important. Drinking plenty of water or other  low-calorie or no-calorie drinks. Drink 6-8 glasses of water daily.  How to recognize withdrawal symptoms Your body and mind may experience discomfort as you try to get used to not having nicotine in your system. These effects are called withdrawal symptoms. They may include: Feeling hungrier than normal. Having trouble concentrating. Feeling irritable or restless. Having trouble sleeping. Feeling depressed. Craving a cigarette. To manage withdrawal symptoms: Avoid places, people, and activities that trigger your cravings. Remember why you want to quit. Get plenty of sleep. Avoid coffee and other caffeinated drinks. These may worsen some of your symptoms. These symptoms may surprise you. But be assured that they are normal to have when quitting smoking. How to manage cravings Come up with a plan for how to deal with your cravings. The plan should include the following: A definition of the specific situation you want to deal with. An alternative action you will take. A clear idea for how this action will help. The name of someone who might help you with this. Cravings usually last for 5-10 minutes. Consider taking the following actions to help you with your plan to deal with cravings: Keep your mouth busy. Chew sugar-free gum. Suck on hard candies or a straw. Brush your teeth. Keep your hands and body busy. Change to a different activity right away. Squeeze or play with a ball. Do an activity or a hobby, such as making bead jewelry, practicing needlepoint, or working with wood. Mix up your normal routine. Take a short exercise break. Go for a quick walk or run up and down stairs. Focus on doing something kind or helpful for someone else. Call a friend or family member to talk during a craving. Join a support group. Contact a quitline. Where to find support To get help or find a support group: Call the National Cancer Institute's Smoking Quitline: 1-800-QUIT NOW (289)880-4002) Visit  the website of the Substance Abuse and Mental Health Services Administration: SkateOasis.com.pt Text QUIT to SmokefreeTXT: 956213 Where to find more information Visit these websites to find more information on quitting smoking: National Cancer Institute: www.smokefree.gov American Lung Association: www.lung.org American Cancer Society: www.cancer.org Centers for Disease Control and Prevention: FootballExhibition.com.br American Heart Association: www.heart.org Contact a health care provider if: You want to change your plan for quitting. The medicines you are taking are not helping. Your eating feels out of control or you cannot sleep. Get help right away if: You feel depressed or become very anxious. Summary Quitting smoking is a physical and mental challenge. You will face cravings, withdrawal symptoms, and temptation to smoke again. Preparation can help you as you go through these challenges. Try different techniques to manage stress, handle social situations, and prevent weight gain. You can deal with cravings by keeping your mouth busy (such as by chewing gum), keeping your hands and body busy, calling family or friends, or contacting a quitline for people who want to quit smoking. You can deal with withdrawal symptoms by avoiding places where people smoke, getting plenty of rest, and avoiding drinks with caffeine. This information is not intended to replace advice given to you by your health care  provider. Make sure you discuss any questions you have with your health care provider. Document Revised: 06/12/2021 Document Reviewed: 07/24/2019 Elsevier Patient Education  [employer].

## 2021-11-25 ENCOUNTER — Other Ambulatory Visit: Payer: Self-pay | Admitting: Family

## 2021-11-25 DIAGNOSIS — M5412 Radiculopathy, cervical region: Secondary | ICD-10-CM

## 2021-11-25 DIAGNOSIS — M25511 Pain in right shoulder: Secondary | ICD-10-CM

## 2021-11-25 DIAGNOSIS — M542 Cervicalgia: Secondary | ICD-10-CM

## 2021-11-25 NOTE — Telephone Encounter (Signed)
[facility]. NTBS given 30 days in Oct w/ NTBS. Last OV was May

## 2021-11-26 NOTE — Telephone Encounter (Signed)
Left message for pt to schedule appt to get med refill

## 2021-12-01 ENCOUNTER — Ambulatory Visit (INDEPENDENT_AMBULATORY_CARE_PROVIDER_SITE_OTHER): Payer: Self-pay | Admitting: Family

## 2021-12-01 ENCOUNTER — Encounter: Payer: Self-pay | Admitting: Family

## 2021-12-01 VITALS — BP 107/77 | HR 109 | Temp 97.9°F | Ht 66.5 in | Wt 122.8 lb

## 2021-12-01 DIAGNOSIS — M2550 Pain in unspecified joint: Secondary | ICD-10-CM

## 2021-12-01 DIAGNOSIS — F411 Generalized anxiety disorder: Secondary | ICD-10-CM

## 2021-12-01 DIAGNOSIS — F331 Major depressive disorder, recurrent, moderate: Secondary | ICD-10-CM

## 2021-12-01 DIAGNOSIS — E039 Hypothyroidism, unspecified: Secondary | ICD-10-CM

## 2021-12-01 DIAGNOSIS — M5412 Radiculopathy, cervical region: Secondary | ICD-10-CM

## 2021-12-01 DIAGNOSIS — Z720 Tobacco use: Secondary | ICD-10-CM

## 2021-12-01 DIAGNOSIS — M25511 Pain in right shoulder: Secondary | ICD-10-CM

## 2021-12-01 DIAGNOSIS — M25512 Pain in left shoulder: Secondary | ICD-10-CM

## 2021-12-01 DIAGNOSIS — M542 Cervicalgia: Secondary | ICD-10-CM

## 2021-12-01 MED ORDER — DICLOFENAC SODIUM 75 MG PO TBEC: 75.0000 mg | DELAYED_RELEASE_TABLET | Freq: Two times a day (BID) | ORAL | 2 refills | Status: DC

## 2021-12-01 NOTE — Progress Notes (Signed)
Subjective:    Patient ID: Michelle Ortega, female    DOB: 08-11-1971, 51 y.o.   MRN: [id number]  Chief Complaint  Patient presents with   Medical Management of Chronic Issues   Pt presents to the office today for chronic follow up. She is having generalized joint pain and is worse in her wrist of a 10 out 10. She has been taking diclofenac 75 mg BID.    She reports her sister died of a MI at the age of 52 years old.  Thyroid Problem Presents for follow-up visit. Symptoms include dry skin and fatigue. Patient reports no anxiety, constipation, hoarse voice or visual change. The symptoms have been stable.  Anxiety Presents for follow-up visit. Symptoms include excessive worry, irritability and restlessness. Patient reports no nervous/anxious behavior. Symptoms occur most days. The severity of symptoms is moderate.    Depression        This is a chronic problem.  The current episode started more than 1 year ago.   The problem occurs intermittently.  Associated symptoms include fatigue and restlessness.  Associated symptoms include no helplessness, no hopelessness and not sad.  Past treatments include SNRIs - Serotonin and norepinephrine reuptake inhibitors.  Compliance with treatment is good.  Past medical history includes thyroid problem and anxiety.   Nicotine Dependence Presents for follow-up visit. Symptoms include fatigue and irritability. Her urge triggers include company of smokers. The symptoms have been stable. She smokes 1 pack of cigarettes per day.  Arthritis Presents for follow-up visit. She complains of pain and stiffness. The symptoms have been stable. Affected locations include the right shoulder, left shoulder, neck, left wrist and right wrist. Her pain is at a severity of 10/10. Associated symptoms include fatigue.     Review of Systems  Constitutional:  Positive for fatigue and irritability.  HENT:  Negative for hoarse voice.   Gastrointestinal:  Negative for  constipation.  Musculoskeletal:  Positive for arthritis and stiffness.  Psychiatric/Behavioral:  Positive for depression. The patient is not nervous/anxious.   All other systems reviewed and are negative.     Objective:   Physical Exam Vitals reviewed.  Constitutional:      General: She is not in acute distress.    Appearance: She is well-developed.  HENT:     Head: Normocephalic and atraumatic.     Right Ear: Tympanic membrane normal.     Left Ear: Tympanic membrane normal.  Eyes:     Pupils: Pupils are equal, round, and reactive to light.  Neck:     Thyroid: No thyromegaly.  Cardiovascular:     Rate and Rhythm: Normal rate and regular rhythm.     Heart sounds: Normal heart sounds. No murmur heard. Pulmonary:     Effort: Pulmonary effort is normal. No respiratory distress.     Breath sounds: Normal breath sounds. No wheezing.  Abdominal:     General: Bowel sounds are normal. There is no distension.     Palpations: Abdomen is soft.     Tenderness: There is no abdominal tenderness.  Musculoskeletal:        General: Tenderness present.     Cervical back: Normal range of motion and neck supple.     Comments: Pain in right wrist with flexion and extension  Skin:    General: Skin is warm and dry.  Neurological:     Mental Status: She is alert and oriented to person, place, and time.     Cranial Nerves: No cranial nerve  deficit.     Deep Tendon Reflexes: Reflexes are normal and symmetric.  Psychiatric:        Behavior: Behavior normal.        Thought Content: Thought content normal.        Judgment: Judgment normal.     BP 107/77    Pulse (!) 109    Temp 97.9 F (36.6 C) (Temporal)    Ht 5' 6.5" (1.689 m)    Wt 122 lb 12.8 oz (55.7 kg)    BMI 19.52 kg/m       Assessment & Plan:  [name] comes in today with chief complaint of Medical Management of Chronic Issues   Diagnosis and orders addressed:  1. Neck pain - diclofenac (VOLTAREN) 75 MG EC tablet; Take 1  tablet (75 mg total) by mouth 2 (two) times daily. (NEEDS TO BE SEEN BEFORE NEXT REFILL)  Dispense: 180 tablet; Refill: 2 - CMP14+EGFR  2. Cervical radiculopathy - diclofenac (VOLTAREN) 75 MG EC tablet; Take 1 tablet (75 mg total) by mouth 2 (two) times daily. (NEEDS TO BE SEEN BEFORE NEXT REFILL)  Dispense: 180 tablet; Refill: 2 - CMP14+EGFR  3. Acute pain of both shoulders - diclofenac (VOLTAREN) 75 MG EC tablet; Take 1 tablet (75 mg total) by mouth 2 (two) times daily. (NEEDS TO BE SEEN BEFORE NEXT REFILL)  Dispense: 180 tablet; Refill: 2 - CMP14+EGFR  4. Hypothyroidism, unspecified type - CMP14+EGFR - TSH  5. Moderate episode of recurrent major depressive disorder (HCC) - CMP14+EGFR  6. GAD (generalized anxiety disorder) - CMP14+EGFR  7. Tobacco abuse - CMP14+EGFR  8. Generalized joint pain - Arthritis Panel - CMP14+EGFR   Labs pending Health Maintenance reviewed Diet and exercise encouraged  Follow up plan: 6 months    Evelina Dun, FNP

## 2021-12-01 NOTE — Patient Instructions (Signed)
Arthritis Arthritis is a term that is commonly used to refer to joint pain or jointdisease. There are more than 100 types of arthritis. What are the causes? The most common cause of this condition is wear and tear of a joint. Other causes include: Gout. Inflammation of a joint. An infection of a joint. Sprains and other injuries near the joint. A reaction to medicines or drugs, or an allergic reaction. In some cases, the cause may not be known. What are the signs or symptoms? The main symptom of this condition is pain in the joint during movement. Other symptoms include: Redness, swelling, or stiffness at a joint. Warmth coming from the joint. Fever. Overall feeling of illness. How is this diagnosed? This condition may be diagnosed with a physical exam and tests, including: Blood tests. Urine tests. Imaging tests, such as X-rays, an MRI, or a CT scan. Sometimes, fluid is removed from a joint for testing. How is this treated? This condition may be treated with: Treatment of the cause, if it is known. Rest. Raising (elevating) the joint. Applying cold or hot packs to the joint. Medicines to improve symptoms and reduce inflammation. Injections of a steroid such as cortisone into the joint to help reduce pain and inflammation. Depending on the cause of your arthritis, you may need to make lifestyle changes to reduce stress on your joint. Changes may include: Exercising more. Losing weight. Follow these instructions at home: Medicines Take over-the-counter and prescription medicines only as told by your health care provider. Do not take aspirin to relieve pain if your health care provider thinks that gout may be causing your pain. Activity Rest your joint if told by your health care provider. Rest is important when your disease is active and your joint feels painful, swollen, or stiff. Avoid activities that make the pain worse. It is important to balance activity with  rest. Exercise your joint regularly with range-of-motion exercises as told by your health care provider. Try doing low-impact exercise, such as: Swimming. Water aerobics. Biking. Walking. Managing pain, stiffness, and swelling     If directed, put ice on the joint. Put ice in a plastic bag. Place a towel between your skin and the bag. Leave the ice on for 20 minutes, 2-3 times per day. If your joint is swollen, raise (elevate) it above the level of your heart if directed by your health care provider. If your joint feels stiff in the morning, try taking a warm shower. If directed, apply heat to the affected area as often as told by your health care provider. Use the heat source that your health care provider recommends, such as a moist heat pack or a heating pad. If you have diabetes, do not apply heat without permission from your health care provider. To apply heat: Place a towel between your skin and the heat source. Leave the heat on for 20-30 minutes. Remove the heat if your skin turns bright red. This is especially important if you are unable to feel pain, heat, or cold. You may have a greater risk of getting burned. General instructions Do not use any products that contain nicotine or tobacco, such as cigarettes, e-cigarettes, and chewing tobacco. If you need help quitting, ask your health care provider. Keep all follow-up visits as told by your health care provider. This is important. Contact a health care provider if: The pain gets worse. You have a fever. Get help right away if: You develop severe joint pain, swelling, or redness. Many joints   become painful and swollen. You develop severe back pain. You develop severe weakness in your leg. You cannot control your bladder or bowels. Summary Arthritis is a term that is commonly used to refer to joint pain or joint disease. There are more than 100 types of arthritis. The most common cause of this condition is wear and tear of a  joint. Other causes include gout, inflammation or infection of the joint, sprains, or allergies. Symptoms of this condition include redness, swelling, or stiffness of the joint. Other symptoms include warmth, fever, or feeling ill. This condition is treated with rest, elevation, medicines, and applying cold or hot packs. Follow your health care provider's instructions about medicines, activity, exercises, and other home care treatments. This information is not intended to replace advice given to you by your health care provider. Make sure you discuss any questions you have with your healthcare provider. Document Revised: 09/11/2018 Document Reviewed: 09/11/2018 Elsevier Patient Education  2022 Elsevier Inc.  

## 2021-12-02 LAB — CMP14+EGFR
ALT: 8 IU/L (ref 0–32)
AST: 12 IU/L (ref 0–40)
Albumin/Globulin Ratio: 0.9 — ABNORMAL LOW (ref 1.2–2.2)
Albumin: 3.8 g/dL (ref 3.8–4.8)
Alkaline Phosphatase: 130 IU/L — ABNORMAL HIGH (ref 44–121)
BUN/Creatinine Ratio: 16 (ref 9–23)
BUN: 8 mg/dL (ref 6–24)
Bilirubin Total: <0.2 mg/dL (ref 0.0–1.2)
CO2: 22 mmol/L (ref 20–29)
Calcium: 9.2 mg/dL (ref 8.7–10.2)
Chloride: 99 mmol/L (ref 96–106)
Creatinine, Ser: 0.49 mg/dL — ABNORMAL LOW (ref 0.57–1.00)
Globulin, Total: 4.1 g/dL (ref 1.5–4.5)
Glucose: 109 mg/dL — ABNORMAL HIGH (ref 70–99)
Potassium: 3.5 mmol/L (ref 3.5–5.2)
Sodium: 137 mmol/L (ref 134–144)
Total Protein: 7.9 g/dL (ref 6.0–8.5)
eGFR: 115 mL/min/{1.73_m2} (ref >59)

## 2021-12-02 LAB — ARTHRITIS PANEL
Basophils Absolute: 0 10*3/uL (ref 0.0–0.2)
Basos: 0 %
EOS (ABSOLUTE): 0.1 10*3/uL (ref 0.0–0.4)
Eos: 1 %
Hematocrit: 37.3 % (ref 34.0–46.6)
Hemoglobin: 12.1 g/dL (ref 11.1–15.9)
Immature Grans (Abs): 0 10*3/uL (ref 0.0–0.1)
Immature Granulocytes: 0 %
Lymphocytes Absolute: 2 10*3/uL (ref 0.7–3.1)
Lymphs: 20 %
MCH: 27.8 pg (ref 26.6–33.0)
MCHC: 32.4 g/dL (ref 31.5–35.7)
MCV: 86 fL (ref 79–97)
Monocytes Absolute: 0.8 10*3/uL (ref 0.1–0.9)
Monocytes: 7 %
Neutrophils Absolute: 7.4 10*3/uL — ABNORMAL HIGH (ref 1.4–7.0)
Neutrophils: 72 %
Platelets: 390 10*3/uL (ref 150–450)
RBC: 4.36 x10E6/uL (ref 3.77–5.28)
RDW: 14 % (ref 11.7–15.4)
Rheumatoid fact SerPl-aCnc: 483 IU/mL — ABNORMAL HIGH (ref <14.0)
Sed Rate: 115 mm/hr — ABNORMAL HIGH (ref 0–40)
Uric Acid: 3.7 mg/dL (ref 2.6–6.2)
WBC: 10.4 10*3/uL (ref 3.4–10.8)

## 2021-12-02 LAB — TSH: TSH: 3.68 u[IU]/mL (ref 0.450–4.500)

## 2021-12-03 ENCOUNTER — Other Ambulatory Visit: Payer: Self-pay | Admitting: Family

## 2021-12-03 DIAGNOSIS — R768 Other specified abnormal immunological findings in serum: Secondary | ICD-10-CM

## 2022-01-19 ENCOUNTER — Other Ambulatory Visit: Payer: Self-pay | Admitting: Family

## 2022-01-19 DIAGNOSIS — F331 Major depressive disorder, recurrent, moderate: Secondary | ICD-10-CM

## 2022-01-19 DIAGNOSIS — F411 Generalized anxiety disorder: Secondary | ICD-10-CM

## 2022-03-19 ENCOUNTER — Other Ambulatory Visit: Payer: Self-pay | Admitting: Family

## 2022-04-23 NOTE — Progress Notes (Deleted)
Office Visit Note  Patient: Michelle Ortega             Date of Birth: Jun 09, 1971           MRN: [id number]             PCP: Sharion Balloon, FNP Referring: Sharion Balloon, FNP Visit Date: 04/30/2022 Occupation: _0 @  Subjective:  No chief complaint on file.   History of Present Illness: Michelle Ortega is a 51 y.o. female ***   Activities of Daily Living:  Patient reports morning stiffness for *** {minute/hour:19697}.   Patient {ACTIONS;DENIES/REPORTS:21021675::"Denies"} nocturnal pain.  Difficulty dressing/grooming: {ACTIONS;DENIES/REPORTS:21021675::"Denies"} Difficulty climbing stairs: {ACTIONS;DENIES/REPORTS:21021675::"Denies"} Difficulty getting out of chair: {ACTIONS;DENIES/REPORTS:21021675::"Denies"} Difficulty using hands for taps, buttons, cutlery, and/or writing: {ACTIONS;DENIES/REPORTS:21021675::"Denies"}  No Rheumatology ROS completed.   PMFS History:  Patient Active Problem List   Diagnosis Date Noted   Tobacco abuse 09/14/2021   PAF (paroxysmal atrial fibrillation) ([place]) 09/14/2021   Tachycardia 07/21/2021   GAD (generalized anxiety disorder) 02/07/2015   Vitamin D deficiency 01/22/2015   Depression 01/20/2015   Pre-syncope 06/06/2013   Vertigo 06/06/2013   Headache(784.0) 06/06/2013   Hypothyroidism 06/06/2013    Past Medical History:  Diagnosis Date   Depression    Thyroid disease     Family History  Problem Relation Age of Onset   Hypothyroidism Mother    Diabetes Father    Diabetes Sister 68       MI   Diabetes Brother    Past Surgical History:  Procedure Laterality Date   BREAST LUMPECTOMY     KNEE SURGERY Left    TONSILLECTOMY     Social History   Social History Narrative   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    Immunization History  Administered Date(s) Administered   Influenza,inj,Quad PF,6+ Mos 07/27/2014   Td 02/07/2015   Tdap 02/07/2015, 06/20/2020     Objective: Vital Signs: There were no vitals taken  for this visit.   Physical Exam   Musculoskeletal Exam: ***  CDAI Exam: CDAI Score: -- Patient Global: --; Provider Global: -- Swollen: --; Tender: -- Joint Exam 04/30/2022   No joint exam has been documented for this visit   There is currently no information documented on the homunculus. Go to the Rheumatology activity and complete the homunculus joint exam.  Investigation: No additional findings.  Imaging: No results found.  Recent Labs: Lab Results  Component Value Date   WBC 10.4 12/01/2021   HGB 12.1 12/01/2021   PLT 390 12/01/2021   NA 137 12/01/2021   K 3.5 12/01/2021   CL 99 12/01/2021   CO2 22 12/01/2021   GLUCOSE 109 (H) 12/01/2021   BUN 8 12/01/2021   CREATININE 0.49 (L) 12/01/2021   BILITOT <0.2 12/01/2021   ALKPHOS 130 (H) 12/01/2021   AST 12 12/01/2021   ALT 8 12/01/2021   PROT 7.9 12/01/2021   ALBUMIN 3.8 12/01/2021   CALCIUM 9.2 12/01/2021   GFRAA 131 01/20/2015    Speciality Comments: No specialty comments available.  Procedures:  No procedures performed Allergies: Erythromycin and Penicillins   Assessment / Plan:     Visit Diagnoses: Rheumatoid factor positive - 12/01/21: Uric acid 3.7, RF 483.0, ESR 115  PAF (paroxysmal atrial fibrillation) (HCC)  History of hypothyroidism  Vitamin D deficiency  Tobacco abuse  GAD (generalized anxiety disorder)  Moderate episode of recurrent major depressive disorder ([place])  Orders: No orders of the defined types were placed in this encounter.  No orders of the defined types were placed in this encounter.   Face-to-face time spent with patient was *** minutes. Greater than 50% of time was spent in counseling and coordination of care.  Follow-Up Instructions: No follow-ups on file.   Ofilia Neas, PA-C  Note - This record has been created using Dragon software.  Chart creation errors have been sought, but may not always  have been located. Such creation errors do not reflect on  the  standard of medical care.

## 2022-04-25 ENCOUNTER — Other Ambulatory Visit: Payer: Self-pay | Admitting: Family

## 2022-04-25 DIAGNOSIS — F411 Generalized anxiety disorder: Secondary | ICD-10-CM

## 2022-04-25 DIAGNOSIS — F331 Major depressive disorder, recurrent, moderate: Secondary | ICD-10-CM

## 2022-04-30 ENCOUNTER — Ambulatory Visit: Payer: Medicaid Other | Admitting: Rheumatology

## 2022-04-30 DIAGNOSIS — Z8639 Personal history of other endocrine, nutritional and metabolic disease: Secondary | ICD-10-CM

## 2022-04-30 DIAGNOSIS — I480 Paroxysmal atrial fibrillation: Secondary | ICD-10-CM

## 2022-04-30 DIAGNOSIS — R768 Other specified abnormal immunological findings in serum: Secondary | ICD-10-CM

## 2022-04-30 DIAGNOSIS — F411 Generalized anxiety disorder: Secondary | ICD-10-CM

## 2022-04-30 DIAGNOSIS — F331 Major depressive disorder, recurrent, moderate: Secondary | ICD-10-CM

## 2022-04-30 DIAGNOSIS — E559 Vitamin D deficiency, unspecified: Secondary | ICD-10-CM

## 2022-04-30 DIAGNOSIS — Z720 Tobacco use: Secondary | ICD-10-CM

## 2022-05-20 ENCOUNTER — Ambulatory Visit: Payer: Medicaid Other | Admitting: Rheumatology

## 2022-07-25 NOTE — Progress Notes (Signed)
Office Visit Note  Patient: Michelle Ortega             Date of Birth: [birth year]           MRN: [id number]             PCP: Sharion Balloon, FNP Referring: Sharion Balloon, FNP Visit Date: 07/26/2022   Subjective:  New Patient (Initial Visit) (RF results. Pain from base of neck with radicular symptoms down shoulder. Joint pain and swelling. )   History of Present Illness: Michelle Ortega is a 51 y.o. female here for evaluation of positive rheumatoid factor associated with multiple joint pains. Xrays of cervical spine and shoulders in May showing mild degenerative changes.  Neck and shoulder pain has been going on for least about 2 years in duration.  She first noticed significant increase in pains while in a long car ride to [place] started having more pain and stiffness that did not fully resolve despite multiple days passing.  As time went on also got increased symptoms affecting her hands and wrists with pain and swelling that started to severely limit activity.  Initially had concern about tickborne illness or infectious cause but did not have any rashes no history of suspicious bite exposures.  Initially symptoms improved but had waxing and waning episodes or flareups over time going on for most of the time.  She has had increased problems in the past 1 year mostly with her hands and wrists becoming the greater problem.  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  She has had previous treatments with anti-inflammatory medications but has never been on drug specifically for rheumatoid arthritis.  Most recently had a good improvement with diclofenac but does not completely resolve the pain and swelling.  She had previous shoulder injection with steroids reported that these were extremely painful experiences was more helpful on the right side than the left.  She has a family history of rheumatoid arthritis reports multiple aunts and  grandmother.  She is a long-term current cigarette smoker.  Labs reviewed 11/2021 RF 483 ESR 115 Uric acid 3.7   Activities of Daily Living:  Patient reports morning stiffness for 1-2 hours.   Patient Reports nocturnal pain.  Difficulty dressing/grooming: Reports Difficulty climbing stairs: Denies Difficulty getting out of chair: Denies Difficulty using hands for taps, buttons, cutlery, and/or writing: Reports  Review of Systems  Constitutional:  Positive for fatigue.  HENT:  Positive for mouth dryness. Negative for mouth sores.   Eyes:  Positive for dryness.  Respiratory:  Positive for shortness of breath.   Cardiovascular:  Positive for palpitations. Negative for chest pain.  Gastrointestinal:  Positive for constipation and diarrhea. Negative for blood in stool.  Endocrine: Negative for increased urination.  Genitourinary:  Positive for involuntary urination.  Musculoskeletal:  Positive for joint pain, joint pain, joint swelling, myalgias, muscle weakness, morning stiffness, muscle tenderness and myalgias. Negative for gait problem.  Skin:  Positive for sensitivity to sunlight. Negative for color change, rash and hair loss.  Allergic/Immunologic: Negative for susceptible to infections.  Neurological:  Positive for dizziness and headaches.  Hematological:  Negative for swollen glands.  Psychiatric/Behavioral:  Positive for depressed mood and sleep disturbance. The patient is nervous/anxious.     PMFS History:  Patient Active Problem List   Diagnosis Date Noted   Rheumatoid factor positive 07/26/2022   High risk medication use 07/26/2022   Radiculopathy, cervical  region 07/26/2022   Bilateral hand pain 07/26/2022   Chest wall pain 07/26/2022   Tobacco abuse 09/14/2021   PAF (paroxysmal atrial fibrillation) ([place]) 09/14/2021   Tachycardia 07/21/2021   GAD (generalized anxiety disorder) 02/07/2015   Vitamin D deficiency 01/22/2015   Depression 01/20/2015   Pre-syncope  06/06/2013   Vertigo 06/06/2013   Headache(784.0) 06/06/2013   Hypothyroidism 06/06/2013    Past Medical History:  Diagnosis Date   A-fib ([place])    Anxiety    Depression    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    Immunization History  Administered Date(s) Administered   Influenza,inj,Quad PF,6+ Mos 07/27/2014   Td 02/07/2015   Tdap 02/07/2015, 06/20/2020     Objective: Vital Signs: BP 118/78 (BP Location: Right Arm, Patient Position: Sitting, Cuff Size: Normal)   Pulse 81   Resp 15   Ht 5' 6"  (1.676 m)   Wt 127 lb (57.6 kg)   BMI 20.50 kg/m    Physical Exam HENT:     Mouth/Throat:     Mouth: Mucous membranes are moist.     Pharynx: Oropharynx is clear.  Eyes:     Conjunctiva/sclera: Conjunctivae normal.  Cardiovascular:     Rate and Rhythm: Normal rate and regular rhythm.  Pulmonary:     Effort: Pulmonary effort is normal.     Breath sounds: Normal breath sounds.  Musculoskeletal:     Right lower leg: No edema.     Left lower leg: No edema.  Lymphadenopathy:     Cervical: No cervical adenopathy.  Skin:    General: Skin is warm and dry.     Findings: No rash.  Neurological:     General: No focal deficit present.     Mental Status: She is alert.  Psychiatric:        Mood and Affect: Mood normal.      Musculoskeletal Exam:  Neck full ROM, pain with lateral rotation in both sides, tenderness to  pressure over lower cervical spine and paraspinal muscles with some radiation Shoulders right side tender with full ROM, left shoulder active ROM limited below horizontal but passive ROM fully intact, mild swelling present Elbows full ROM no tenderness or swelling Right wrist swelling, prominent appearing ulnar styloid Fingers full ROM no tenderness or swelling Knees full ROM no tenderness or swelling Ankles full ROM no tenderness or swelling MTPs full ROM no tenderness or swelling   CDAI Exam: CDAI Score: 16  Patient Global: 50 mm; Provider Global: 40 mm Swollen: 2 ; Tender: 5  Joint Exam 07/26/2022      Right  Left  Glenohumeral   Tender  Swollen Tender  Wrist  Swollen Tender     MCP 2  Tender     MCP 3   Tender        Investigation: No additional findings.  Imaging: No results found.  Recent Labs: Lab Results  Component Value Date   WBC 10.4 12/01/2021   HGB 12.1 12/01/2021   PLT 390 12/01/2021   NA 137 12/01/2021   K 3.5 12/01/2021   CL 99 12/01/2021   CO2 22 12/01/2021   GLUCOSE 109 (H) 12/01/2021   BUN 8 12/01/2021   CREATININE 0.49 (L) 12/01/2021   BILITOT <0.2 12/01/2021   ALKPHOS 130 (H) 12/01/2021   AST 12 12/01/2021   ALT 8 12/01/2021   PROT 7.9 12/01/2021   ALBUMIN 3.8 12/01/2021   CALCIUM 9.2 12/01/2021   GFRAA 131 01/20/2015    Speciality Comments: No specialty comments available.  Procedures:  No procedures performed Allergies: Erythromycin and Penicillins   Assessment / Plan:     Visit Diagnoses: Rheumatoid factor positive  Bilateral hand pain - Plan: XR Hand 2 View Right, XR Hand 2 View Left, Cyclic citrul peptide antibody, IgG, Sedimentation rate, C-reactive protein, ANA   Presentation highly consistent for seropositive rheumatoid arthritis only a few joints with definitive synovitis but she is also on oral anti-inflammatory medication.  Checking sedimentation rate and CRP for inflammatory disease activity assessment.  Also checking  CCP antibodies with her smoking history.  We will need to get baseline labs but will plan to start with methotrexate unless contraindicated review high risk medication in more detail at follow-up visit.  X-ray of bilateral hands does not show any erosive disease there are extensive cystic changes in the right wrist consistent with the evident inflammation on exam.  High risk medication use  Chest wall pain - Plan: Hepatitis B core antibody, IgM, Hepatitis B surface antigen, QuantiFERON-TB Gold Plus, Serum protein electrophoresis with reflex, CBC with Differential/Platelet, COMPLETE METABOLIC PANEL WITH GFR  Anticipating need to start DMARD treatment for likely rheumatoid arthritis.  Checking hepatitis serology QuantiFERON gold CBC and CMP.  Getting 2 view chest x-ray for baseline screening as well as with reported chest wall pain for any structural abnormality.  Radiculopathy, cervical region  Known multilevel mild degenerative disease most recent cervical spine x-ray reviewed from May 2022.  I think this is unlikely to be related with the rheumatoid arthritis with the lack of peripheral erosive disease and appears in a typical distribution.  Currently no red flags for weakness reflex changes or loss of sensation.  Orders: Orders Placed This Encounter  Procedures   XR Hand 2 View Right   XR Hand 2 View Left   DG Chest 2 View   Cyclic citrul peptide antibody, IgG   Sedimentation rate   C-reactive protein   Hepatitis B core antibody, IgM   Hepatitis B surface antigen   QuantiFERON-TB Gold Plus   Serum protein electrophoresis with reflex   CBC with Differential/Platelet   COMPLETE METABOLIC PANEL WITH GFR   ANA   No orders of the defined types were placed in this encounter.    Follow-Up Instructions: Return in about 2 weeks (around 08/09/2022) for New pt RA f/u 2wks.   Collier Salina, MD  Note - This record has been created using [employer].  Chart creation errors have  been sought, but may not always  have been located. Such creation errors do not reflect on  the standard of medical care.

## 2022-07-26 ENCOUNTER — Ambulatory Visit: Payer: Self-pay | Attending: Internal Medicine | Admitting: Internal Medicine

## 2022-07-26 ENCOUNTER — Encounter: Payer: Self-pay | Admitting: Internal Medicine

## 2022-07-26 ENCOUNTER — Ambulatory Visit (INDEPENDENT_AMBULATORY_CARE_PROVIDER_SITE_OTHER): Payer: Self-pay

## 2022-07-26 ENCOUNTER — Ambulatory Visit
Admission: RE | Admit: 2022-07-26 | Discharge: 2022-07-26 | Disposition: A | Discharge status: Home / Self Care | Payer: No Typology Code available for payment source | Source: Ambulatory Visit | Attending: Internal Medicine | Admitting: Internal Medicine

## 2022-07-26 VITALS — BP 118/78 | HR 81 | Resp 15 | Ht 66.0 in | Wt 127.0 lb

## 2022-07-26 DIAGNOSIS — M79642 Pain in left hand: Secondary | ICD-10-CM

## 2022-07-26 DIAGNOSIS — R0789 Other chest pain: Secondary | ICD-10-CM

## 2022-07-26 DIAGNOSIS — R768 Other specified abnormal immunological findings in serum: Secondary | ICD-10-CM

## 2022-07-26 DIAGNOSIS — M79641 Pain in right hand: Secondary | ICD-10-CM

## 2022-07-26 DIAGNOSIS — Z79899 Other long term (current) drug therapy: Secondary | ICD-10-CM

## 2022-07-26 DIAGNOSIS — M059 Rheumatoid arthritis with rheumatoid factor, unspecified: Secondary | ICD-10-CM | POA: Insufficient documentation

## 2022-07-26 DIAGNOSIS — M5412 Radiculopathy, cervical region: Secondary | ICD-10-CM

## 2022-07-30 LAB — CBC WITH DIFFERENTIAL/PLATELET
Absolute Monocytes: 475 cells/uL (ref 200–950)
Basophils Absolute: 29 cells/uL (ref 0–200)
Basophils Relative: 0.3 %
Eosinophils Absolute: 58 cells/uL (ref 15–500)
Eosinophils Relative: 0.6 %
HCT: 37.2 % (ref 35.0–45.0)
Hemoglobin: 12.4 g/dL (ref 11.7–15.5)
Lymphs Abs: 2668 cells/uL (ref 850–3900)
MCH: 28.8 pg (ref 27.0–33.0)
MCHC: 33.3 g/dL (ref 32.0–36.0)
MCV: 86.5 fL (ref 80.0–100.0)
MPV: 9.8 fL (ref 7.5–12.5)
Monocytes Relative: 4.9 %
Neutro Abs: 6470 cells/uL (ref 1500–7800)
Neutrophils Relative %: 66.7 %
Platelets: 354 10*3/uL (ref 140–400)
RBC: 4.3 10*6/uL (ref 3.80–5.10)
RDW: 14.4 % (ref 11.0–15.0)
Total Lymphocyte: 27.5 %
WBC: 9.7 10*3/uL (ref 3.8–10.8)

## 2022-07-30 LAB — COMPLETE METABOLIC PANEL WITH GFR
AG Ratio: 1 (calc) (ref 1.0–2.5)
ALT: 5 U/L — ABNORMAL LOW (ref 6–29)
AST: 11 U/L (ref 10–35)
Albumin: 3.8 g/dL (ref 3.6–5.1)
Alkaline phosphatase (APISO): 115 U/L (ref 37–153)
BUN: 10 mg/dL (ref 7–25)
CO2: 23 mmol/L (ref 20–32)
Calcium: 9.1 mg/dL (ref 8.6–10.4)
Chloride: 104 mmol/L (ref 98–110)
Creat: 0.55 mg/dL (ref 0.50–1.03)
Globulin: 3.9 g/dL (calc) — ABNORMAL HIGH (ref 1.9–3.7)
Glucose, Bld: 94 mg/dL (ref 65–99)
Potassium: 3.8 mmol/L (ref 3.5–5.3)
Sodium: 137 mmol/L (ref 135–146)
Total Bilirubin: 0.2 mg/dL (ref 0.2–1.2)
Total Protein: 7.7 g/dL (ref 6.1–8.1)
eGFR: 111 mL/min/{1.73_m2} (ref >=60)

## 2022-07-30 LAB — QUANTIFERON-TB GOLD PLUS
Mitogen-NIL: 8.03 IU/mL
NIL: 0.01 IU/mL
QuantiFERON-TB Gold Plus: NEGATIVE
TB1-NIL: 0 IU/mL
TB2-NIL: 0.02 IU/mL

## 2022-07-30 LAB — PROTEIN ELECTROPHORESIS, SERUM, WITH REFLEX
Albumin ELP: 3.6 g/dL — ABNORMAL LOW (ref 3.8–4.8)
Alpha 1: 0.4 g/dL — ABNORMAL HIGH (ref 0.2–0.3)
Alpha 2: 1 g/dL — ABNORMAL HIGH (ref 0.5–0.9)
Beta 2: 0.8 g/dL — ABNORMAL HIGH (ref 0.2–0.5)
Beta Globulin: 0.5 g/dL (ref 0.4–0.6)
Gamma Globulin: 1.4 g/dL (ref 0.8–1.7)
Total Protein: 7.6 g/dL (ref 6.1–8.1)

## 2022-07-30 LAB — CYCLIC CITRUL PEPTIDE ANTIBODY, IGG: Cyclic Citrullin Peptide Ab: 214 UNITS — ABNORMAL HIGH

## 2022-07-30 LAB — HEPATITIS B CORE ANTIBODY, IGM: Hep B C IgM: NONREACTIVE

## 2022-07-30 LAB — C-REACTIVE PROTEIN: CRP: 16.7 mg/L — ABNORMAL HIGH (ref <8.0)

## 2022-07-30 LAB — ANA: Anti Nuclear Antibody (ANA): POSITIVE — AB

## 2022-07-30 LAB — ANTI-NUCLEAR AB-TITER (ANA TITER): ANA Titer 1: 1:40 {titer} — ABNORMAL HIGH

## 2022-07-30 LAB — HEPATITIS B SURFACE ANTIGEN: Hepatitis B Surface Ag: NONREACTIVE

## 2022-07-30 LAB — SEDIMENTATION RATE: Sed Rate: 72 mm/h — ABNORMAL HIGH (ref 0–30)

## 2022-08-06 NOTE — Progress Notes (Signed)
Office Visit Note  Patient: Michelle Ortega             Date of Birth: [birth year]           MRN: [id number]             PCP: Sharion Balloon, FNP Referring: Sharion Balloon, FNP Visit Date: 08/12/2022   Subjective:  Follow-up (Feeling about the same, a little less pain in left shoulder. )   History of Present Illness: Michelle Ortega is a 51 y.o. female here for follow up for evaluation of positive rheumatoid factor associated with multiple joint pains initial workup was positive for CCP antibodies and serum inflammatory markers and ANA was 1: 40 titer.  Chest x-ray was clear and bilateral hand x-rays showed changes throughout the right wrist primarily with multiple cystic lesions suggestive for chronic nonerosive inflammatory arthritis.  Since then her symptoms remain about the same slightly less left shoulder pain but still having daily symptoms with swelling and stiffness and joint pains especially in the upper extremities.   Previous HPI AMBRY Ortega is a 51 y.o. female here for evaluation of positive rheumatoid factor associated with multiple joint pains. Xrays of cervical spine and shoulders in May showing mild degenerative changes.  Neck and shoulder pain has been going on for least about 2 years in duration.  She first noticed significant increase in pains while in a long car ride to [place] started having more pain and stiffness that did not fully resolve despite multiple days passing.  As time went on also got increased symptoms affecting her hands and wrists with pain and swelling that started to severely limit activity.  Initially had concern about tickborne illness or infectious cause but did not have any rashes no history of suspicious bite exposures.  Initially symptoms improved but had waxing and waning episodes or flareups over time going on for most of the time.  She has had increased problems in the past 1 year mostly with her hands and wrists becoming the greater problem.   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  She has had previous treatments with anti-inflammatory medications but has never been on drug specifically for rheumatoid arthritis.  Most recently had a good improvement with diclofenac but does not completely resolve the pain and swelling.  She had previous shoulder injection with steroids reported that these were extremely painful experiences was more helpful on the right side than the left.  She has a family history of rheumatoid arthritis reports multiple aunts and grandmother. She is a long-term current cigarette smoker.   Labs reviewed 11/2021 RF 483 ESR 115 Uric acid 3.7     Activities of Daily Living:  Patient reports morning stiffness for 1-2 hours.   Patient Reports nocturnal pain.  Difficulty dressing/grooming: Reports Difficulty climbing stairs: Denies Difficulty getting out of chair: Denies Difficulty using hands for taps, buttons, cutlery, and/or writing: Reports   Review of Systems  Constitutional:  Positive for fatigue.  HENT:  Positive for mouth dryness. Negative for mouth sores.   Eyes:  Positive for dryness.  Respiratory:  Positive for shortness of breath.   Cardiovascular:  Negative for chest pain and palpitations.  Gastrointestinal:  Positive for constipation and diarrhea. Negative for blood in stool.  Endocrine: Negative for increased urination.  Genitourinary:  Positive for involuntary urination.  Musculoskeletal:  Positive for joint pain, joint pain, joint swelling, myalgias, muscle weakness,  morning stiffness, muscle tenderness and myalgias. Negative for gait problem.  Skin:  Positive for sensitivity to sunlight. Negative for color change, rash and hair loss.  Allergic/Immunologic: Negative for susceptible to infections.  Neurological:  Negative for dizziness and headaches.  Hematological:  Negative for swollen glands.  Psychiatric/Behavioral:  Positive for  depressed mood. Negative for sleep disturbance. The patient is nervous/anxious.     PMFS History:  Patient Active Problem List   Diagnosis Date Noted   Seropositive rheumatoid arthritis ([place]) 07/26/2022   High risk medication use 07/26/2022   Radiculopathy, cervical region 07/26/2022   Bilateral hand pain 07/26/2022   Chest wall pain 07/26/2022   Tobacco abuse 09/14/2021   PAF (paroxysmal atrial fibrillation) ([place]) 09/14/2021   Tachycardia 07/21/2021   GAD (generalized anxiety disorder) 02/07/2015   Vitamin D deficiency 01/22/2015   Depression 01/20/2015   Pre-syncope 06/06/2013   Vertigo 06/06/2013   Headache(784.0) 06/06/2013   Hypothyroidism 06/06/2013    Past Medical History:  Diagnosis Date   A-fib ([place])    Anxiety    Depression    Mild degeneration of cervical intervertebral disc    Pott's disease    Thyroid disease     Family History  Problem Relation Age of Onset   Hypothyroidism Mother    Hypertension Mother    Diabetes Father    Heart disease Father    Kidney disease Father    Diabetes Sister 16       MI   Heart attack Sister    Diabetes Sister    Diabetes Brother    Depression Daughter    Healthy Son    Past Surgical History:  Procedure Laterality Date   BREAST LUMPECTOMY     KNEE SURGERY Left    TONSILLECTOMY     Social History   Social History Narrative   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    Immunization History  Administered Date(s) Administered   Influenza,inj,Quad PF,6+ Mos 07/27/2014   Td 02/07/2015   Tdap 02/07/2015, 06/20/2020     Objective: Vital Signs: BP 127/86 (BP Location: Left Arm, Patient Position: Sitting, Cuff Size: Normal)   Pulse 93   Resp 15   Ht 5' 6"  (1.676 m)   Wt 125 lb 6.4 oz (56.9 kg)   BMI 20.24 kg/m    Physical Exam Eyes:     Conjunctiva/sclera: Conjunctivae normal.  Cardiovascular:     Rate and Rhythm: Normal rate and regular rhythm.  Pulmonary:     Effort: Pulmonary effort is normal.      Breath sounds: Normal breath sounds.  Musculoskeletal:     Right lower leg: No edema.     Left lower leg: No edema.  Skin:    General: Skin is warm and dry.     Findings: No rash.  Psychiatric:        Mood and Affect: Mood normal.       Musculoskeletal Exam:  Neck full ROM, pain with lateral rotation in both sides Shoulders right side tender with full ROM, left shoulder tender and guarding below horizontal level of active abduction Elbows full ROM no tenderness or swelling Right wrist swelling, prominent appearing ulnar styloid Fingers full ROM no tenderness or swelling Knees full ROM no tenderness or swelling  Investigation: No additional findings.  Imaging: No results found.  Recent Labs: Lab Results  Component Value Date   WBC 11.7 (H) 09/17/2022   HGB 12.5 09/17/2022   PLT 353 09/17/2022   NA 138  09/17/2022   K 3.8 09/17/2022   CL 105 09/17/2022   CO2 23 09/17/2022   GLUCOSE 90 09/17/2022   BUN 12 09/17/2022   CREATININE 0.48 (L) 09/17/2022   BILITOT 0.2 09/17/2022   ALKPHOS 130 (H) 12/01/2021   AST 12 09/17/2022   ALT 7 09/17/2022   PROT 7.4 09/17/2022   ALBUMIN 3.8 12/01/2021   CALCIUM 9.3 09/17/2022   GFRAA 131 01/20/2015   QFTBGOLDPLUS NEGATIVE 07/26/2022    Speciality Comments: No specialty comments available.  Procedures:  No procedures performed Allergies: Erythromycin and Penicillins   Assessment / Plan:     Visit Diagnoses: Seropositive rheumatoid arthritis ([place]) - Plan: DISCONTINUED: methotrexate (RHEUMATREX) 2.5 MG tablet, DISCONTINUED: folic acid (FOLVITE) 1 MG tablet  Findings are consistent with seropositive rheumatoid arthritis of multiple sites without any erosive changes.  Discussed disease and treatment options.  Plan to start methotrexate 15 mg p.o. weekly and folic acid 1 mg daily.  Disease activity appears considerably high but definitely want close follow-up for medication titration or consider additional treatments if not  responding.  High risk medication use  Discussed risks of methotrexate including infections, cytopenias, hepatotoxicity, and most common of GI intolerance or effect on nails skin or hair.  Baseline labs look fine.  Reviewed avoiding excessive alcohol consumption while on this medicine.  Will repeat labs in about 1 month after starting for follow-up for medication monitoring.  Chest wall pain  Somewhat nonspecific could be costochondritis but there were no abnormalities noted on chest x-ray.  Orders: No orders of the defined types were placed in this encounter.  Meds ordered this encounter  Medications   DISCONTD: methotrexate (RHEUMATREX) 2.5 MG tablet    Sig: Take 6 tablets (15 mg total) by mouth once a week. Caution:Chemotherapy. Protect from light.    Dispense:  30 tablet    Refill:  0   DISCONTD: folic acid (FOLVITE) 1 MG tablet    Sig: Take 1 tablet (1 mg total) by mouth daily.    Dispense:  90 tablet    Refill:  0     Follow-Up Instructions: Return in about 4 weeks (around 09/09/2022) for RA new MTX start f/u 4wks.   Collier Salina, MD  Note - This record has been created using [employer].  Chart creation errors have been sought, but may not always  have been located. Such creation errors do not reflect on  the standard of medical care.

## 2022-08-12 ENCOUNTER — Ambulatory Visit: Payer: Self-pay | Attending: Internal Medicine | Admitting: Internal Medicine

## 2022-08-12 ENCOUNTER — Encounter: Payer: Self-pay | Admitting: Internal Medicine

## 2022-08-12 VITALS — BP 127/86 | HR 93 | Resp 15 | Ht 66.0 in | Wt 125.4 lb

## 2022-08-12 DIAGNOSIS — R768 Other specified abnormal immunological findings in serum: Secondary | ICD-10-CM

## 2022-08-12 DIAGNOSIS — M5412 Radiculopathy, cervical region: Secondary | ICD-10-CM

## 2022-08-12 DIAGNOSIS — Z79899 Other long term (current) drug therapy: Secondary | ICD-10-CM

## 2022-08-12 DIAGNOSIS — M79641 Pain in right hand: Secondary | ICD-10-CM

## 2022-08-12 DIAGNOSIS — M059 Rheumatoid arthritis with rheumatoid factor, unspecified: Secondary | ICD-10-CM

## 2022-08-12 DIAGNOSIS — R0789 Other chest pain: Secondary | ICD-10-CM

## 2022-08-12 MED ORDER — FOLIC ACID 1 MG PO TABS: 1.0000 mg | ORAL_TABLET | Freq: Every day | ORAL | 0 refills | Status: DC

## 2022-08-12 MED ORDER — METHOTREXATE SODIUM 2.5 MG PO TABS: 15.0000 mg | ORAL_TABLET | ORAL | 0 refills | Status: DC

## 2022-08-12 NOTE — Patient Instructions (Signed)
Methotrexate Tablets What is this medication? METHOTREXATE (METH oh TREX ate) treats inflammatory conditions such as arthritis and psoriasis. It works by decreasing inflammation, which can reduce pain and prevent long-term injury to the joints and skin. It may also be used to treat some types of cancer. It works by slowing down the growth of cancer cells. This medicine may be used for other purposes; ask your health care provider or pharmacist if you have questions. COMMON BRAND NAME(S): Rheumatrex, Trexall What should I tell my care team before I take this medication? They need to know if you have any of these conditions: Fluid in the stomach area or lungs If you often drink alcohol Infection or immune system problems Kidney disease or on hemodialysis Liver disease Low blood counts, like low white cell, platelet, or red cell counts Lung disease Radiation therapy Stomach ulcers Ulcerative colitis An unusual or allergic reaction to methotrexate, other medications, foods, dyes, or preservatives Pregnant or trying to get pregnant Breast-feeding How should I use this medication? Take this medication by mouth with a glass of water. Follow the directions on the prescription label. Take your medication at regular intervals. Do not take it more often than directed. Do not stop taking except on your care team's advice. Make sure you know why you are taking this medication and how often you should take it. If this medication is used for a condition that is not cancer, like arthritis or psoriasis, it should be taken weekly, NOT daily. Taking this medication more often than directed can cause serious side effects, even death. Talk to your care team about safe handling and disposal of this medication. You may need to take special precautions. Talk to your care team about the use of this medication in children. While this medication may be prescribed for selected conditions, precautions do  apply. Overdosage: If you think you have taken too much of this medicine contact a poison control center or emergency room at once. NOTE: This medicine is only for you. Do not share this medicine with others. What if I miss a dose? If you miss a dose, talk with your care team. Do not take double or extra doses. What may interact with this medication? Do not take this medication with any of the following: Acitretin This medication may also interact with the following: Aspirin and aspirin-like medications including salicylates Azathioprine Certain antibiotics like penicillins, tetracycline, and chloramphenicol Certain medications that treat or prevent blood clots like warfarin, apixaban, dabigatran, and rivaroxaban Certain medications for stomach problems like esomeprazole, omeprazole, pantoprazole Cyclosporine Dapsone Diuretics Gold Hydroxychloroquine Live virus vaccines Medications for infection like acyclovir, adefovir, amphotericin B, bacitracin, cidofovir, foscarnet, ganciclovir, gentamicin, pentamidine, vancomycin Mercaptopurine NSAIDs, medications for pain and inflammation, like ibuprofen or naproxen Other cytotoxic agents Pamidronate Pemetrexed Penicillamine Phenylbutazone Phenytoin Probenecid Pyrimethamine Retinoids such as isotretinoin and tretinoin Steroid medications like prednisone or cortisone Sulfonamides like sulfasalazine and trimethoprim/sulfamethoxazole Theophylline Zoledronic acid This list may not describe all possible interactions. Give your health care provider a list of all the medicines, herbs, non-prescription drugs, or dietary supplements you use. Also tell them if you smoke, drink alcohol, or use illegal drugs. Some items may interact with your medicine. What should I watch for while using this medication? Avoid alcoholic drinks. This medication can make you more sensitive to the sun. Keep out of the sun. If you cannot avoid being in the sun, wear  protective clothing and use sunscreen. Do not use sun lamps or tanning beds/booths. You may need   blood work done while you are taking this medication. Call your care team for advice if you get a fever, chills or sore throat, or other symptoms of a cold or flu. Do not treat yourself. This medication decreases your body's ability to fight infections. Try to avoid being around people who are sick. This medication may increase your risk to bruise or bleed. Call your care team if you notice any unusual bleeding. Be careful brushing or flossing your teeth or using a toothpick because you may get an infection or bleed more easily. If you have any dental work done, tell your dentist you are receiving this medication. Check with your care team if you get an attack of severe diarrhea, nausea and vomiting, or if you sweat a lot. The loss of too much body fluid can make it dangerous for you to take this medication. Talk to your care team about your risk of cancer. You may be more at risk for certain types of cancers if you take this medication. Do not become pregnant while taking this medication or for 6 months after stopping it. Women should inform their care team if they wish to become pregnant or think they might be pregnant. Men should not father a child while taking this medication and for 3 months after stopping it. There is potential for serious harm to an unborn child. Talk to your care team for more information. Do not breast-feed an infant while taking this medication or for 1 week after stopping it. This medication may make it more difficult to get pregnant or father a child. Talk to your care team if you are concerned about your fertility. What side effects may I notice from receiving this medication? Side effects that you should report to your care team as soon as possible: Allergic reactions--skin rash, itching, hives, swelling of the face, lips, tongue, or throat Blood clot--pain, swelling, or warmth  in the leg, shortness of breath, chest pain Dry cough, shortness of breath or trouble breathing Infection--fever, chills, cough, sore throat, wounds that don't heal, pain or trouble when passing urine, general feeling of discomfort or being unwell Kidney injury--decrease in the amount of urine, swelling of the ankles, hands, or feet Liver injury--right upper belly pain, loss of appetite, nausea, light-colored stool, dark yellow or brown urine, yellowing of the skin or eyes, unusual weakness or fatigue Low red blood cell count--unusual weakness or fatigue, dizziness, headache, trouble breathing Redness, blistering, peeling, or loosening of the skin, including inside the mouth Seizures Unusual bruising or bleeding Side effects that usually do not require medical attention (report to your care team if they continue or are bothersome): Diarrhea Dizziness Hair loss Nausea Pain, redness, or swelling with sores inside the mouth or throat Vomiting This list may not describe all possible side effects. Call your doctor for medical advice about side effects. You may report side effects to FDA at 1-800-FDA-1088. Where should I keep my medication? Keep out of the reach of children and pets. Store at room temperature between 20 and 25 degrees C (68 and 77 degrees F). Protect from light. Get rid of any unused medication after the expiration date. Talk to your care team about how to dispose of unused medication. Special directions may apply. NOTE: This sheet is a summary. It may not cover all possible information. If you have questions about this medicine, talk to your doctor, pharmacist, or health care provider.  2023 [employer]/Gold Standard (2020-12-08 00:00:00)  

## 2022-09-17 ENCOUNTER — Ambulatory Visit: Payer: Medicaid Other | Attending: Internal Medicine | Admitting: Internal Medicine

## 2022-09-17 ENCOUNTER — Encounter: Payer: Self-pay | Admitting: Internal Medicine

## 2022-09-17 VITALS — BP 106/71 | HR 75 | Resp 15 | Ht 66.0 in | Wt 124.0 lb

## 2022-09-17 DIAGNOSIS — M5412 Radiculopathy, cervical region: Secondary | ICD-10-CM | POA: Diagnosis not present

## 2022-09-17 DIAGNOSIS — M059 Rheumatoid arthritis with rheumatoid factor, unspecified: Secondary | ICD-10-CM | POA: Diagnosis not present

## 2022-09-17 DIAGNOSIS — Z79899 Other long term (current) drug therapy: Secondary | ICD-10-CM

## 2022-09-17 NOTE — Progress Notes (Signed)
Office Visit Note  Patient: Michelle Ortega             Date of Birth: [birth year]           MRN: [id number]             PCP: Sharion Balloon, FNP Referring: Sharion Balloon, FNP Visit Date: 09/17/2022   Subjective:  Follow-up (Left shoulder and right wrist pain)   History of Present Illness: Michelle Ortega is a 51 y.o. female here for follow up for seropositive RA after starting methotrexate 15 mg PO weekly and folic acid 1 mg daily.  Since starting methotrexate she has seen a decrease in joint swelling there is still a little bit ongoing at the right wrist.  However still has joint pain and stiffness in multiple areas despite the improvement in swelling.  Left shoulder pain is bothering her with some limited range of motion due to pain going more towards the neck and upper back and shoulder.  She had 1 episode of nausea lasting for about 2 days after her third dose of methotrexate but has taken the medicine 5 times total with no problem on the other 4 occasions.  Previous HPI 08/12/22   Previous HPI 07/26/22 Michelle Ortega is a 51 y.o. female here for evaluation of positive rheumatoid factor associated with multiple joint pains. Xrays of cervical spine and shoulders in May showing mild degenerative changes.  Neck and shoulder pain has been going on for least about 2 years in duration.  She first noticed significant increase in pains while in a long car ride to [place] started having more pain and stiffness that did not fully resolve despite multiple days passing.  As time went on also got increased symptoms affecting her hands and wrists with pain and swelling that started to severely limit activity.  Initially had concern about tickborne illness or infectious cause but did not have any rashes no history of suspicious bite exposures.  Initially symptoms improved but had waxing and waning episodes or flareups over time going on for most of the time.  She has had increased problems in the past  1 year mostly with her hands and wrists becoming the greater problem.  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  She has had previous treatments with anti-inflammatory medications but has never been on drug specifically for rheumatoid arthritis.  Most recently had a good improvement with diclofenac but does not completely resolve the pain and swelling.  She had previous shoulder injection with steroids reported that these were extremely painful experiences was more helpful on the right side than the left.  She has a family history of rheumatoid arthritis reports multiple aunts and grandmother. She is a long-term current cigarette smoker.   Labs reviewed 11/2021 RF 483 ESR 115 Uric acid 3.7   Review of Systems  Constitutional:  Positive for fatigue.  HENT:  Positive for mouth dryness. Negative for mouth sores.   Eyes:  Positive for dryness.  Respiratory:  Negative for shortness of breath.   Cardiovascular:  Positive for palpitations. Negative for chest pain.  Gastrointestinal:  Positive for constipation and diarrhea. Negative for blood in stool.  Endocrine: Negative for increased urination.  Genitourinary:  Negative for involuntary urination.  Musculoskeletal:  Positive for joint pain, joint pain, joint swelling, myalgias, muscle weakness, morning stiffness, muscle tenderness and myalgias. Negative for gait problem.  Skin:  Positive for rash  and sensitivity to sunlight. Negative for color change and hair loss.  Allergic/Immunologic: Positive for susceptible to infections.  Neurological:  Negative for dizziness and headaches.  Hematological:  Negative for swollen glands.  Psychiatric/Behavioral:  Positive for depressed mood. Negative for sleep disturbance. The patient is nervous/anxious.     PMFS History:  Patient Active Problem List   Diagnosis Date Noted   Seropositive rheumatoid arthritis ([place]) 07/26/2022   High risk medication  use 07/26/2022   Radiculopathy, cervical region 07/26/2022   Bilateral hand pain 07/26/2022   Chest wall pain 07/26/2022   Tobacco abuse 09/14/2021   PAF (paroxysmal atrial fibrillation) ([place]) 09/14/2021   Tachycardia 07/21/2021   GAD (generalized anxiety disorder) 02/07/2015   Vitamin D deficiency 01/22/2015   Depression 01/20/2015   Pre-syncope 06/06/2013   Vertigo 06/06/2013   Headache(784.0) 06/06/2013   Hypothyroidism 06/06/2013    Past Medical History:  Diagnosis Date   A-fib ([place])    Anxiety    Depression    Mild degeneration of cervical intervertebral disc    Pott's disease    Thyroid disease     Family History  Problem Relation Age of Onset   Hypothyroidism Mother    Hypertension Mother    Diabetes Father    Heart disease Father    Kidney disease Father    Diabetes Sister 4       MI   Heart attack Sister    Diabetes Sister    Diabetes Brother    Depression Daughter    Healthy Son    Past Surgical History:  Procedure Laterality Date   BREAST LUMPECTOMY     KNEE SURGERY Left    TONSILLECTOMY     Social History   Social History Narrative   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    Immunization History  Administered Date(s) Administered   Influenza,inj,Quad PF,6+ Mos 07/27/2014   Td 02/07/2015   Tdap 02/07/2015, 06/20/2020     Objective: Vital Signs: BP 106/71 (BP Location: Left Arm, Patient Position: Sitting, Cuff Size: Normal)   Pulse 75   Resp 15   Ht _0  (1.676 m)   Wt 124 lb (56.2 kg)   BMI 20.01 kg/m    Physical Exam Cardiovascular:     Rate and Rhythm: Normal rate and regular rhythm.  Pulmonary:     Effort: Pulmonary effort is normal.     Breath sounds: Normal breath sounds.  Musculoskeletal:     Right lower leg: No edema.     Left lower leg: No edema.  Skin:    General: Skin is warm and dry.     Findings: No rash.  Neurological:     Mental Status: She is alert.  Psychiatric:        Mood and Affect: Mood normal.       Musculoskeletal Exam:  Shoulders full passive range of motion but active left shoulder overhead abduction is restricted due to pain, no palpable swelling Elbows full ROM no tenderness or swelling Wrists full ROM right wrist prominent ulnar head there is tenderness and swelling present at radial side of the wrist joint Fingers full ROM no tenderness or swelling Knees full ROM no tenderness or swelling   CDAI Exam: CDAI Score: 9  Patient Global: 40 mm; Provider Global: 20 mm Swollen: 1 ; Tender: 2  Joint Exam 09/17/2022      Right  Left  Glenohumeral      Tender  Wrist  Swollen Tender  Investigation: No additional findings.  Imaging: No results found.  Recent Labs: Lab Results  Component Value Date   WBC 9.7 07/26/2022   HGB 12.4 07/26/2022   PLT 354 07/26/2022   NA 137 07/26/2022   K 3.8 07/26/2022   CL 104 07/26/2022   CO2 23 07/26/2022   GLUCOSE 94 07/26/2022   BUN 10 07/26/2022   CREATININE 0.55 07/26/2022   BILITOT 0.2 07/26/2022   ALKPHOS 130 (H) 12/01/2021   AST 11 07/26/2022   ALT 5 (L) 07/26/2022   PROT 7.6 07/26/2022   PROT 7.7 07/26/2022   ALBUMIN 3.8 12/01/2021   CALCIUM 9.1 07/26/2022   GFRAA 131 01/20/2015   QFTBGOLDPLUS NEGATIVE 07/26/2022    Speciality Comments: No specialty comments available.  Procedures:  No procedures performed Allergies: Erythromycin and Penicillins   Assessment / Plan:     Visit Diagnoses: Seropositive rheumatoid arthritis ([place]) - Plan: Sedimentation rate  Still appears to have moderately active disease although objective synovitis is now restricted to the right wrist definitely improving on methotrexate.  Will recheck sedimentation rate for disease activity monitoring.  If labs indicate she is tolerating the medicine well but remains elevated will recommend titration up to 20 mg p.o. weekly dosing continue folic acid 1 mg daily.  If she has increased GI side effects she is amenable to trying subcutaneous  route.  High risk medication use - Plan: CBC with Differential/Platelet, COMPLETE METABOLIC PANEL WITH GFR  Checking CBC and CMP for methotrexate toxicity monitoring.  Radiculopathy, cervical region  I suspect left shoulder pain is currently more related to her neck pain and upper back and shoulder muscular symptoms more so than the glenohumeral joint arthritis.  She is not interested in trying repeat of intra-articular steroid injection due to a lot of pain with this in the past.  Orders: Orders Placed This Encounter  Procedures   Sedimentation rate   CBC with Differential/Platelet   COMPLETE METABOLIC PANEL WITH GFR   No orders of the defined types were placed in this encounter.    Follow-Up Instructions: Return in about 3 months (around 12/17/2022) for RA on MTX f/u 4mo.   CCollier Salina MD  Note - This record has been created using [employer]  Chart creation errors have been sought, but may not always  have been located. Such creation errors do not reflect on  the standard of medical care.

## 2022-09-18 LAB — COMPLETE METABOLIC PANEL WITH GFR
AG Ratio: 1.2 (calc) (ref 1.0–2.5)
ALT: 7 U/L (ref 6–29)
AST: 12 U/L (ref 10–35)
Albumin: 4.1 g/dL (ref 3.6–5.1)
Alkaline phosphatase (APISO): 101 U/L (ref 37–153)
BUN/Creatinine Ratio: 25 (calc) — ABNORMAL HIGH (ref 6–22)
BUN: 12 mg/dL (ref 7–25)
CO2: 23 mmol/L (ref 20–32)
Calcium: 9.3 mg/dL (ref 8.6–10.4)
Chloride: 105 mmol/L (ref 98–110)
Creat: 0.48 mg/dL — ABNORMAL LOW (ref 0.50–1.03)
Globulin: 3.3 g/dL (calc) (ref 1.9–3.7)
Glucose, Bld: 90 mg/dL (ref 65–139)
Potassium: 3.8 mmol/L (ref 3.5–5.3)
Sodium: 138 mmol/L (ref 135–146)
Total Bilirubin: 0.2 mg/dL (ref 0.2–1.2)
Total Protein: 7.4 g/dL (ref 6.1–8.1)
eGFR: 115 mL/min/{1.73_m2} (ref >=60)

## 2022-09-18 LAB — CBC WITH DIFFERENTIAL/PLATELET
Absolute Monocytes: 714 cells/uL (ref 200–950)
Basophils Absolute: 35 cells/uL (ref 0–200)
Basophils Relative: 0.3 %
Eosinophils Absolute: 23 cells/uL (ref 15–500)
Eosinophils Relative: 0.2 %
HCT: 37.4 % (ref 35.0–45.0)
Hemoglobin: 12.5 g/dL (ref 11.7–15.5)
Lymphs Abs: 2633 cells/uL (ref 850–3900)
MCH: 29.5 pg (ref 27.0–33.0)
MCHC: 33.4 g/dL (ref 32.0–36.0)
MCV: 88.2 fL (ref 80.0–100.0)
MPV: 10.3 fL (ref 7.5–12.5)
Monocytes Relative: 6.1 %
Neutro Abs: 8295 cells/uL — ABNORMAL HIGH (ref 1500–7800)
Neutrophils Relative %: 70.9 %
Platelets: 353 10*3/uL (ref 140–400)
RBC: 4.24 10*6/uL (ref 3.80–5.10)
RDW: 15.6 % — ABNORMAL HIGH (ref 11.0–15.0)
Total Lymphocyte: 22.5 %
WBC: 11.7 10*3/uL — ABNORMAL HIGH (ref 3.8–10.8)

## 2022-09-18 LAB — SEDIMENTATION RATE: Sed Rate: 55 mm/h — ABNORMAL HIGH (ref 0–30)

## 2022-09-20 MED ORDER — METHOTREXATE SODIUM 2.5 MG PO TABS: 20.0000 mg | ORAL_TABLET | ORAL | 0 refills | Status: DC

## 2022-09-20 MED ORDER — FOLIC ACID 1 MG PO TABS: 1.0000 mg | ORAL_TABLET | Freq: Every day | ORAL | 1 refills | Status: DC

## 2022-09-20 NOTE — Addendum Note (Signed)
Addended by: Fuller Plan on: 09/20/2022 01:29 PM   Modules accepted: Orders

## 2022-09-20 NOTE — Progress Notes (Signed)
Liver and kidney tests are normal no problems from the methotrexate. Her sedimentation rate decreased from 72 to 55 which is improving but still not normal. I recommend she try increasing the methotrexate to 8 tablets each week instead of 6, taken split up 4 tablets at a time.

## 2022-12-16 ENCOUNTER — Encounter: Payer: Self-pay | Admitting: Radiology

## 2022-12-17 ENCOUNTER — Encounter: Payer: Self-pay | Admitting: Internal Medicine

## 2022-12-17 ENCOUNTER — Ambulatory Visit: Payer: Medicaid Other | Attending: Internal Medicine | Admitting: Internal Medicine

## 2022-12-17 ENCOUNTER — Telehealth: Payer: Self-pay | Admitting: Pharmacist

## 2022-12-17 VITALS — BP 106/72 | HR 97 | Resp 14 | Ht 66.25 in | Wt 128.0 lb

## 2022-12-17 DIAGNOSIS — M79642 Pain in left hand: Secondary | ICD-10-CM | POA: Diagnosis not present

## 2022-12-17 DIAGNOSIS — M79641 Pain in right hand: Secondary | ICD-10-CM

## 2022-12-17 DIAGNOSIS — M059 Rheumatoid arthritis with rheumatoid factor, unspecified: Secondary | ICD-10-CM | POA: Diagnosis not present

## 2022-12-17 DIAGNOSIS — Z79899 Other long term (current) drug therapy: Secondary | ICD-10-CM

## 2022-12-17 MED ORDER — TRIAMCINOLONE ACETONIDE 40 MG/ML IJ SUSP
40.0000 mg | INTRAMUSCULAR | Status: AC | PRN
  Administered 2022-12-17: 40 mg via INTRA_ARTICULAR

## 2022-12-17 MED ORDER — LIDOCAINE HCL 1 % IJ SOLN
3.0000 mL | INTRAMUSCULAR | Status: AC | PRN
  Administered 2022-12-17: 3 mL

## 2022-12-17 NOTE — Progress Notes (Signed)
Office Visit Note  Patient: Michelle Ortega             Date of Birth: 14-Nov-1970           MRN: [id number]             PCP: Sharion Balloon, FNP Referring: Sharion Balloon, FNP Visit Date: 12/17/2022   Subjective:  Follow-up   History of Present Illness: Michelle Ortega is a 52 y.o. female here for follow up for seropositive RA on methotrexate that was increased to 20 mg p.o. weekly after last visit and folic acid 1 mg daily.  She has not seen much more improvement with persistent right wrist pain and swelling and left shoulder pain are most problematic areas.  Joint elsewhere are doing better.  Since increasing the methotrexate dose she feels more sick she has had to take it on the weekend and is overall dissatisfied with the benefit on higher dose.  She has not had any serious infections since her last visit.  Previous HPI 09/17/22 Michelle Ortega is a 52 y.o. female here for follow up for seropositive RA after starting methotrexate 15 mg PO weekly and folic acid 1 mg daily.  Since starting methotrexate she has seen a decrease in joint swelling there is still a little bit ongoing at the right wrist.  However still has joint pain and stiffness in multiple areas despite the improvement in swelling.  Left shoulder pain is bothering her with some limited range of motion due to pain going more towards the neck and upper back and shoulder.  She had 1 episode of nausea lasting for about 2 days after her third dose of methotrexate but has taken the medicine 5 times total with no problem on the other 4 occasions.     Previous HPI 07/26/22 Michelle Ortega is a 52 y.o. female here for evaluation of positive rheumatoid factor associated with multiple joint pains. Xrays of cervical spine and shoulders in May showing mild degenerative changes.  Neck and shoulder pain has been going on for least about 2 years in duration.  She first noticed significant increase in pains while in a long car ride to [place]  started having more pain and stiffness that did not fully resolve despite multiple days passing.  As time went on also got increased symptoms affecting her hands and wrists with pain and swelling that started to severely limit activity.  Initially had concern about tickborne illness or infectious cause but did not have any rashes no history of suspicious bite exposures.  Initially symptoms improved but had waxing and waning episodes or flareups over time going on for most of the time.  She has had increased problems in the past 1 year mostly with her hands and wrists becoming the greater problem.  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  She has had previous treatments with anti-inflammatory medications but has never been on drug specifically for rheumatoid arthritis.  Most recently had a good improvement with diclofenac but does not completely resolve the pain and swelling.  She had previous shoulder injection with steroids reported that these were extremely painful experiences was more helpful on the right side than the left.  She has a family history of rheumatoid arthritis reports multiple aunts and grandmother. She is a long-term current cigarette smoker.   Labs reviewed 11/2021 RF 483 ESR 115 Uric acid 3.7   Review of  Systems  Constitutional:  Positive for fatigue.  HENT:  Negative for mouth sores and mouth dryness.   Eyes:  Positive for dryness.  Respiratory:  Positive for shortness of breath.   Cardiovascular:  Positive for chest pain and palpitations.  Gastrointestinal:  Positive for constipation and diarrhea. Negative for blood in stool.  Endocrine: Negative for increased urination.  Genitourinary:  Positive for involuntary urination.  Musculoskeletal:  Positive for joint pain, joint pain, joint swelling, myalgias, muscle weakness, morning stiffness, muscle tenderness and myalgias. Negative for gait problem.  Skin:  Positive for  hair loss and sensitivity to sunlight. Negative for color change and rash.  Allergic/Immunologic: Negative for susceptible to infections.  Neurological:  Negative for dizziness and headaches.  Hematological:  Negative for swollen glands.  Psychiatric/Behavioral:  Positive for depressed mood. Negative for sleep disturbance. The patient is nervous/anxious.     PMFS History:  Patient Active Problem List   Diagnosis Date Noted   Seropositive rheumatoid arthritis ([place]) 07/26/2022   High risk medication use 07/26/2022   Radiculopathy, cervical region 07/26/2022   Bilateral hand pain 07/26/2022   Chest wall pain 07/26/2022   Tobacco abuse 09/14/2021   PAF (paroxysmal atrial fibrillation) ([place]) 09/14/2021   Tachycardia 07/21/2021   GAD (generalized anxiety disorder) 02/07/2015   Vitamin D deficiency 01/22/2015   Depression 01/20/2015   Pre-syncope 06/06/2013   Vertigo 06/06/2013   Headache(784.0) 06/06/2013   Hypothyroidism 06/06/2013    Past Medical History:  Diagnosis Date   A-fib ([place])    Anxiety    Depression    Mild degeneration of cervical intervertebral disc    Pott's disease    Thyroid disease     Family History  Problem Relation Age of Onset   Hypothyroidism Mother    Hypertension Mother    Diabetes Father    Heart disease Father    Kidney disease Father    Diabetes Sister 26       MI   Heart attack Sister    Diabetes Sister    Diabetes Brother    Depression Daughter    Healthy Son    Past Surgical History:  Procedure Laterality Date   BREAST LUMPECTOMY     KNEE SURGERY Left    TONSILLECTOMY     Social History   Social History Narrative   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    Immunization History  Administered Date(s) Administered   Influenza,inj,Quad PF,6+ Mos 07/27/2014   Td 02/07/2015   Tdap 02/07/2015, 06/20/2020     Objective: Vital Signs: BP 106/72 (BP Location: Right Arm, Patient Position: Sitting, Cuff Size: Normal)   Pulse 97    Resp 14   Ht 5' 6.25" (1.683 m)   Wt 128 lb (58.1 kg)   BMI 20.50 kg/m    Physical Exam Eyes:     Conjunctiva/sclera: Conjunctivae normal.     Comments: Small xanthelasma medial and superior of left eye  Cardiovascular:     Rate and Rhythm: Normal rate and regular rhythm.  Pulmonary:     Effort: Pulmonary effort is normal.     Breath sounds: Normal breath sounds.  Lymphadenopathy:     Cervical: No cervical adenopathy.  Skin:    General: Skin is warm and dry.     Findings: No rash.  Neurological:     Mental Status: She is alert.  Psychiatric:        Mood and Affect: Mood normal.      Musculoskeletal Exam:  Neck  full ROM no tenderness Left shoulder pain and stiffness, passive abduction and external rotation intact but very painful Elbows full ROM no tenderness or swelling Right wrist swelling, prominent ulnar styloid bilaterally Fingers full ROM no tenderness or swelling Knees full ROM no tenderness or swelling   CDAI Exam: CDAI Score: 9  Patient Global: 40 mm; Provider Global: 20 mm Swollen: 1 ; Tender: 2  Joint Exam 12/17/2022      Right  Left  Glenohumeral      Tender  Wrist  Swollen Tender        Investigation: No additional findings.  Imaging: No results found.  Recent Labs: Lab Results  Component Value Date   WBC 11.7 (H) 09/17/2022   HGB 12.5 09/17/2022   PLT 353 09/17/2022   NA 138 09/17/2022   K 3.8 09/17/2022   CL 105 09/17/2022   CO2 23 09/17/2022   GLUCOSE 90 09/17/2022   BUN 12 09/17/2022   CREATININE 0.48 (L) 09/17/2022   BILITOT 0.2 09/17/2022   ALKPHOS 130 (H) 12/01/2021   AST 12 09/17/2022   ALT 7 09/17/2022   PROT 7.4 09/17/2022   ALBUMIN 3.8 12/01/2021   CALCIUM 9.3 09/17/2022   GFRAA 131 01/20/2015   QFTBGOLDPLUS NEGATIVE 07/26/2022    Speciality Comments: No specialty comments available.  Procedures:  Large Joint Inj: L subacromial bursa on 12/17/2022 11:25 AM Indications: pain Details: 25 G 1.5 in needle, lateral  approach Medications: 3 mL lidocaine 1 %; 40 mg triamcinolone acetonide 40 MG/ML Outcome: tolerated well, no immediate complications Procedure, treatment alternatives, risks and benefits explained, specific risks discussed. Consent was given by the patient. Immediately prior to procedure a time out was called to verify the correct patient, procedure, equipment, support staff and site/side marked as required. Patient was prepped and draped in the usual sterile fashion.     Allergies: Erythromycin and Penicillins   Assessment / Plan:     Visit Diagnoses: Seropositive rheumatoid arthritis ([place]) - Plan: Sedimentation rate  Symptoms not well controlled despite increased methotrexate dose and also not tolerating this well. Will recheck sed rate for disease activity assessment see whether any response on serology for methotrexate. Left shoulder injection today for worsening pain and mobility. Plan to decreased back to 15 mg PO weekly that was better tolerated. Plan to start humira 40 mg [place] q14days.  High risk medication use - Plan: CBC with Differential/Platelet, COMPLETE METABOLIC PANEL WITH GFR  Checking CBC and CMP for medication monitoring on the increased dose of methotrexate. No significant interval infections reported. Plan to start Humira, discussed risks of medication including injection site reactions, allergic reactions, infections, and long term risk of malignancy. No personal history of skin cancer or lymphoma or heart failure.  Bilateral hand pain  Active RA, not too much visible inflammation in finger joints but right wrist remains swollen. Overall is partially better compared to last year.  Orders: Orders Placed This Encounter  Procedures   Large Joint Inj   Sedimentation rate   CBC with Differential/Platelet   COMPLETE METABOLIC PANEL WITH GFR   No orders of the defined types were placed in this encounter.    Follow-Up Instructions: Return in about 2 months (around  02/16/2023) for RA on MTX/ADA start f/u 66mo.   CCollier Salina MD  Note - This record has been created using [employer]  Chart creation errors have been sought, but may not always  have been located. Such creation errors do not reflect on  the  standard of medical care.

## 2022-12-17 NOTE — Patient Instructions (Signed)
Adalimumab Injection What is this medication? ADALIMUMAB (ay da LIM yoo mab) treats autoimmune conditions, such as psoriasis, arthritis, Crohn's disease, and ulcerative colitis. It works by slowing down an overactive immune system. It belongs to a group of medications called TNF inhibitors. It is a monoclonal antibody. This medicine may be used for other purposes; ask your health care provider or pharmacist if you have questions. COMMON BRAND NAME(S): ABRILADA, AMJEVITA, CYLTEZO, HADLIMA, Hulio, Hulio PEN, Humira, Hyrimoz, Riley Lam, YUSIMRY What should I tell my care team before I take this medication? They need to know if you have any of these conditions: Cancer Diabetes (high blood sugar) Having surgery Heart disease Hepatitis B Immune system problems Infections, such as tuberculosis (TB) or other bacterial, fungal, or viral infections Multiple sclerosis Recent or upcoming vaccine An unusual or allergic reaction to adalimumab, mannitol, latex, rubber, other medications, foods, dyes, or preservatives Pregnant or trying to get pregnant Breast-feeding How should I use this medication? This medication is injected under the skin. It may be given by your care team in a hospital or clinic setting. It may also be given at home. If you get this medication at home, you will be taught how to prepare and give it. Use exactly as directed. Take it as directed on the prescription label. Keep taking it unless your care team tells you to stop. This medication comes with INSTRUCTIONS FOR USE. Ask your pharmacist for directions on how to use this medication. Read the information carefully. Talk to your pharmacist or care team if you have questions. It is important that you put your used needles and syringes in a special sharps container. Do not put them in a trash can. If you do not have a sharps container, call your pharmacist or care team to get one. A special MedGuide will be given to you by the  pharmacist with each prescription and refill. If you are getting this medication in a hospital or clinic, a special MedGuide will be given to you before each treatment. Be sure to read this information carefully each time. Talk to your care team about the use of this medication in children. While it be prescribed for children as young as 2 years for selected conditions, precautions do apply. Overdosage: If you think you have taken too much of this medicine contact a poison control center or emergency room at once. NOTE: This medicine is only for you. Do not share this medicine with others. What if I miss a dose? If you get this medication at the hospital or clinic: it is important not to miss your dose. Call your care team if you are unable to keep an appointment. If you give yourself this medication at home: If you miss a dose, take it as soon as you can. If it is almost time for your next dose, take only that dose. Do not take double or extra doses. Call your care team with questions. What may interact with this medication? Do not take this medication with any of the following: Abatacept [place]ra Biologic medications, such as certolizumab, etanercept, golimumab, infliximab Live virus vaccines This medication may also interact with the following: Cyclosporine Theophylline Vaccines Warfarin This list may not describe all possible interactions. Give your health care provider a list of all the medicines, herbs, non-prescription drugs, or dietary supplements you use. Also tell them if you smoke, drink alcohol, or use illegal drugs. Some items may interact with your medicine. What should I watch for while using this medication?  Visit your care team for regular checks on your progress. Tell your care team if your symptoms do not start to get better or if they get worse. You will be tested for tuberculosis (TB) before you start this medication. If your care team prescribes any medication for TB, you  should start taking the TB medication before starting this medication. Make sure to finish the full course of TB medication. This medication may increase your risk of getting an infection. Call your care team for advice if you get a fever, chills, sore throat, or other symptoms of a cold or flu. Do not treat yourself. Try to avoid being around people who are sick. Talk to your care team about your risk of cancer. You may be more at risk for certain types of cancer if you take this medication. What side effects may I notice from receiving this medication? Side effects that you should report to your care team as soon as possible: Allergic reactions--skin rash, itching, hives, swelling of the face, lips, tongue, or throat Aplastic anemia--unusual weakness or fatigue, dizziness, headache, trouble breathing, increased bleeding or bruising Body pain, tingling, or numbness Heart failure--shortness of breath, swelling of the ankles, feet, or hands, sudden weight gain, unusual weakness or fatigue Infection--fever, chills, cough, sore throat, wounds that don't heal, pain or trouble when passing urine, general feeling of discomfort or being unwell Lupus-like syndrome--joint pain, swelling, or stiffness, butterfly-shaped rash on the face, rashes that get worse in the sun, fever, unusual weakness or fatigue Unusual bruising or bleeding Side effects that usually do not require medical attention (report to your care team if they continue or are bothersome): Headache Nausea Pain, redness, or irritation at injection site Runny or stuffy nose Sore throat Stomach pain This list may not describe all possible side effects. Call your doctor for medical advice about side effects. You may report side effects to FDA at 1-800-FDA-1088. Where should I keep my medication? Keep out of the reach of children and pets. Store in the refrigerator. Do not freeze. Keep this medication in the original packaging until you are ready  to take it. Protect from light. Get rid of any unused medication after the expiration date. This medication may be stored at room temperature for up to 14 days. Keep this medication in the original packaging. Protect from light. If it is stored at room temperature, get rid of any unused medication after 14 days or after it expires, whichever is first. To get rid of medications that are no longer needed or have expired: Take the medication to a medication take-back program. Check with your pharmacy or law enforcement to find a location. If you cannot return the medication, ask your pharmacist or care team how to get rid of this medication safely. NOTE: This sheet is a summary. It may not cover all possible information. If you have questions about this medicine, talk to your doctor, pharmacist, or health care provider.  2023 Elsevier/Gold Standard (2021-12-17 00:00:00)

## 2022-12-17 NOTE — Telephone Encounter (Addendum)
Please start Humira SQ BIV pending OV note from 12/17/22  Dose: '40mg'$  SQ every 14 days  Dx: RA  Previously tried therapies: MTX - inadequate clinical response  Therapies patient unable to try: JAK inhibitors (Rinvoq, Olumiant, Morrie Sheldon) - current smoker and risk of CV events with this clsas of meds  Knox Saliva, PharmD, MPH, BCPS, CPP Clinical Pharmacist (Rheumatology and Pulmonology)  ----- Message from Shona Needles, RT sent at 12/17/2022 11:43 AM EST ----- Regarding: NEW START HUMIRA

## 2022-12-18 LAB — CBC WITH DIFFERENTIAL/PLATELET
Absolute Monocytes: 774 cells/uL (ref 200–950)
Basophils Absolute: 42 cells/uL (ref 0–200)
Basophils Relative: 0.4 %
Eosinophils Absolute: 95 cells/uL (ref 15–500)
Eosinophils Relative: 0.9 %
HCT: 38.1 % (ref 35.0–45.0)
Hemoglobin: 13 g/dL (ref 11.7–15.5)
Lymphs Abs: 2756 cells/uL (ref 850–3900)
MCH: 31.3 pg (ref 27.0–33.0)
MCHC: 34.1 g/dL (ref 32.0–36.0)
MCV: 91.6 fL (ref 80.0–100.0)
MPV: 10.3 fL (ref 7.5–12.5)
Monocytes Relative: 7.3 %
Neutro Abs: 6932 cells/uL (ref 1500–7800)
Neutrophils Relative %: 65.4 %
Platelets: 325 10*3/uL (ref 140–400)
RBC: 4.16 10*6/uL (ref 3.80–5.10)
RDW: 13.3 % (ref 11.0–15.0)
Total Lymphocyte: 26 %
WBC: 10.6 10*3/uL (ref 3.8–10.8)

## 2022-12-18 LAB — COMPLETE METABOLIC PANEL WITH GFR
AG Ratio: 1.3 (calc) (ref 1.0–2.5)
ALT: 8 U/L (ref 6–29)
AST: 13 U/L (ref 10–35)
Albumin: 4.2 g/dL (ref 3.6–5.1)
Alkaline phosphatase (APISO): 100 U/L (ref 37–153)
BUN/Creatinine Ratio: 24 (calc) — ABNORMAL HIGH (ref 6–22)
BUN: 12 mg/dL (ref 7–25)
CO2: 24 mmol/L (ref 20–32)
Calcium: 9.4 mg/dL (ref 8.6–10.4)
Chloride: 104 mmol/L (ref 98–110)
Creat: 0.49 mg/dL — ABNORMAL LOW (ref 0.50–1.03)
Globulin: 3.3 g/dL (calc) (ref 1.9–3.7)
Glucose, Bld: 99 mg/dL (ref 65–99)
Potassium: 4 mmol/L (ref 3.5–5.3)
Sodium: 137 mmol/L (ref 135–146)
Total Bilirubin: 0.2 mg/dL (ref 0.2–1.2)
Total Protein: 7.5 g/dL (ref 6.1–8.1)
eGFR: 114 mL/min/{1.73_m2} (ref >=60)

## 2022-12-18 LAB — SEDIMENTATION RATE: Sed Rate: 46 mm/h — ABNORMAL HIGH (ref 0–30)

## 2022-12-20 NOTE — Progress Notes (Signed)
Sed rate of 46 this is slightly better than 55 last time but still not normal. So maybe the methotrexate increase contributed but she can go back down to 6 tablets weekly. Kidney and liver function tests normal no problems for starting Humira as planned.

## 2022-12-20 NOTE — Telephone Encounter (Signed)
Submitted a Prior Authorization request to  [facility]  for HUMIRA via CoverMyMeds. Will update once we receive a response.  Key: [id number]  Knox Saliva, PharmD, MPH, BCPS, CPP Clinical Pharmacist (Rheumatology and Pulmonology)

## 2022-12-22 ENCOUNTER — Other Ambulatory Visit (HOSPITAL_COMMUNITY): Payer: Self-pay

## 2022-12-22 ENCOUNTER — Telehealth: Payer: Self-pay

## 2022-12-22 ENCOUNTER — Other Ambulatory Visit: Payer: Self-pay

## 2022-12-22 MED ORDER — HUMIRA (2 PEN) 40 MG/0.4ML ~~LOC~~ AJKT
40.0000 mg | AUTO-INJECTOR | SUBCUTANEOUS | 0 refills | Status: DC
  Filled 2022-12-22: qty 2, 28d supply, fill #0

## 2022-12-22 NOTE — Telephone Encounter (Signed)
Patient contacted the office to return a missed call from the Pharmacist about her Humira. Patient advised the Pharmacist has left the office. Patient advised a message would be sent to the Pharmacist. Patient's call back number is [phone].

## 2022-12-22 NOTE — Telephone Encounter (Signed)
Received notification from  [facility]  regarding a prior authorization for HUMIRA. Authorization has been APPROVED from 12/20/22 to 12/20/23. Approval letter sent to scan center.  Per test claim, copay for 28 days supply is $4  Patient can fill through [place]: [phone]   Authorization # [id number]  Rx sent to [facility] to be couriered to clinic 12/28/22.  ATC patient to schedule appt for on or after 12/28/22.  Knox Saliva, PharmD, MPH, BCPS, CPP Clinical Pharmacist (Rheumatology and Pulmonology)

## 2022-12-23 ENCOUNTER — Other Ambulatory Visit (HOSPITAL_COMMUNITY): Payer: Self-pay

## 2022-12-23 NOTE — Telephone Encounter (Signed)
Left VM for pt for Humira new start. If pt returns call, please schedule Humira new start appt on or after 12/29/2022 as long as she has no active infections, is not on antibiotics, and has no upcoming invasive surgeries  Knox Saliva, PharmD, MPH, BCPS, CPP Clinical Pharmacist (Rheumatology and Pulmonology)

## 2022-12-24 ENCOUNTER — Other Ambulatory Visit: Payer: Self-pay

## 2022-12-24 ENCOUNTER — Other Ambulatory Visit (HOSPITAL_COMMUNITY): Payer: Self-pay

## 2022-12-24 NOTE — Telephone Encounter (Signed)
Per [facility], rx has been marked as "delivered" as of today at 8:27am  $4 copay has been billed to [facility] account.

## 2022-12-28 NOTE — Progress Notes (Unsigned)
Pharmacy Note  Subjective:   Patient presents to clinic today to receive first dose of Humira for rheumatoid arthritis. Not eligible for JAK inhibitor therapy as she is a current smoker and risk of CV events with this medication class. Patient currently takes methotrexate 20 mg once weekly with inadequate clinical response.  Patient running a fever or have signs/symptoms of infection? No  Patient currently on antibiotics for the treatment of infection? No  Patient have any upcoming invasive procedures/surgeries? No  Objective: CMP     Component Value Date/Time   NA 137 12/17/2022 1124   NA 137 12/01/2021 1122   K 4.0 12/17/2022 1124   CL 104 12/17/2022 1124   CO2 24 12/17/2022 1124   GLUCOSE 99 12/17/2022 1124   BUN 12 12/17/2022 1124   BUN 8 12/01/2021 1122   CREATININE 0.49 (L) 12/17/2022 1124   CALCIUM 9.4 12/17/2022 1124   PROT 7.5 12/17/2022 1124   PROT 7.9 12/01/2021 1122   ALBUMIN 3.8 12/01/2021 1122   AST 13 12/17/2022 1124   ALT 8 12/17/2022 1124   ALKPHOS 130 (H) 12/01/2021 1122   BILITOT 0.2 12/17/2022 1124   BILITOT <0.2 12/01/2021 1122   GFRNONAA 114 01/20/2015 1618   GFRAA 131 01/20/2015 1618    CBC    Component Value Date/Time   WBC 10.6 12/17/2022 1124   RBC 4.16 12/17/2022 1124   HGB 13.0 12/17/2022 1124   HGB 12.1 12/01/2021 1122   HCT 38.1 12/17/2022 1124   HCT 37.3 12/01/2021 1122   PLT 325 12/17/2022 1124   PLT 390 12/01/2021 1122   MCV 91.6 12/17/2022 1124   MCV 86 12/01/2021 1122   MCH 31.3 12/17/2022 1124   MCHC 34.1 12/17/2022 1124   RDW 13.3 12/17/2022 1124   RDW 14.0 12/01/2021 1122   LYMPHSABS 2,756 12/17/2022 1124   LYMPHSABS 2.0 12/01/2021 1122   EOSABS 95 12/17/2022 1124   EOSABS 0.1 12/01/2021 1122   BASOSABS 42 12/17/2022 1124   BASOSABS 0.0 12/01/2021 1122    Baseline Immunosuppressant Therapy Labs TB GOLD    Latest Ref Rng & Units 07/26/2022   11:16 AM  Quantiferon TB Gold  Quantiferon TB Gold Plus NEGATIVE NEGATIVE     Hepatitis Panel    Latest Ref Rng & Units 07/26/2022   11:16 AM  Hepatitis  Hep B Surface Ag NON-REACTIVE NON-REACTIVE   Hep B IgM NON-REACTIVE NON-REACTIVE    HIV No results found for: "HIV" Immunoglobulins   SPEP    Latest Ref Rng & Units 12/17/2022   11:24 AM  Serum Protein Electrophoresis  Total Protein 6.1 - 8.1 g/dL 7.5    G6PD No results found for: "G6PDH" TPMT No results found for: "TPMT"   Chest x-ray (07/26/2022): No active cardiopulmonary disease.   Assessment/Plan:  Reviewed importance of holding Humira with signs/symptoms of an infections, if antibiotics are prescribed to treat an active infection, and with invasive procedures  Demonstrated proper injection technique with Humira demo device  Patient able to demonstrate proper injection technique using the teach back method.  Patient self injected in the left lower abdomen with:  Sample Medication: Humira 40 mg/0.4 mL NDC: [id number] Lot: [id number] Expiration: 07/17/2024  Patient tolerated well.  Patient moved injection device early and some of the medication was not administered. She stated that she has someone who will help with the administration process for next time to avoid this. Observed for 30 mins in office for adverse reaction and Humira.   Patient is to  return in 1 month for labs and 6-8 weeks for follow-up appointment.  Standing orders for CBC/CMP placed.  TB gold will be monitored yearly.  Referral to Dr. Allyn Kenner for Dermatology placed today for yearly skin checks while on TNF inhibitor due to risk for non melanoma skin cancer  Humira approved through insurance .   Rx sent to: [facility]: [phone] .  Patient provided with pharmacy phone number and advised to call later this week to schedule shipment to home.  Patient will continue Humira subcutaneously every 14 days in combination with methotrexate 15 mg once weekly.  All questions encouraged and answered.   Instructed patient to call with any further questions or concerns.  Maryan Puls, PharmD PGY-1 [facility] Pharmacy Resident

## 2022-12-29 ENCOUNTER — Ambulatory Visit: Payer: Medicaid Other | Attending: Internal Medicine | Admitting: Pharmacist

## 2022-12-29 ENCOUNTER — Other Ambulatory Visit (HOSPITAL_COMMUNITY): Payer: Self-pay

## 2022-12-29 ENCOUNTER — Other Ambulatory Visit: Payer: Self-pay

## 2022-12-29 DIAGNOSIS — Z79899 Other long term (current) drug therapy: Secondary | ICD-10-CM

## 2022-12-29 DIAGNOSIS — M059 Rheumatoid arthritis with rheumatoid factor, unspecified: Secondary | ICD-10-CM

## 2022-12-29 MED ORDER — HUMIRA (2 PEN) 40 MG/0.4ML ~~LOC~~ AJKT
40.0000 mg | AUTO-INJECTOR | SUBCUTANEOUS | 1 refills | Status: DC
  Filled 2022-12-29 – 2023-01-13 (×2): qty 2, 28d supply, fill #0
  Filled 2023-02-15: qty 2, 28d supply, fill #1

## 2022-12-29 MED ORDER — METHOTREXATE SODIUM 2.5 MG PO TABS: 15.0000 mg | ORAL_TABLET | ORAL | 0 refills | Status: DC

## 2022-12-29 MED ORDER — FOLIC ACID 1 MG PO TABS: 1.0000 mg | ORAL_TABLET | Freq: Every day | ORAL | 1 refills | Status: DC

## 2022-12-29 NOTE — Patient Instructions (Addendum)
Your next HUMIRA dose is due on 3/27, 4/10, and every 14 days thereafter  Decrease methotrexate to 15 mg once weekly. Continue folic acid supplementation.   HOLD HUMIRA if you have signs or symptoms of an infection. You can resume once you feel better or back to your baseline. HOLD HUMIRA if you start antibiotics to treat an infection. HOLD HUMIRA around the time of surgery/procedures. Your surgeon will be able to provide recommendations on when to hold BEFORE and when you are cleared to [place].  Pharmacy information: Your prescription will be shipped from [facility]. Their phone number is [phone] Please call to schedule shipment and confirm address. They will mail your medication to your home.  Labs are due in 1 month then every 3 months. Lab hours are from Monday to Thursday 8am-12:30pm and 1pm-5pm and Friday 8am-12pm. You do not need an appointment if you come for labs during these times.  How to manage an injection site reaction: Remember the 5 C's: COUNTER - leave on the counter at least 30 minutes but up to overnight to bring medication to room temperature. This may help prevent stinging COLD - place something cold (like an ice gel pack or cold water bottle) on the injection site just before cleansing with alcohol. This may help reduce pain CLARITIN - use Claritin (generic name is loratadine) for the first two weeks of treatment or the day of, the day before, and the day after injecting. This will help to minimize injection site reactions CORTISONE CREAM - apply if injection site is irritated and itching CALL ME - if injection site reaction is bigger than the size of your fist, looks infected, blisters, or if you develop hive

## 2022-12-30 ENCOUNTER — Other Ambulatory Visit: Payer: Self-pay | Admitting: Family

## 2022-12-30 DIAGNOSIS — M25511 Pain in right shoulder: Secondary | ICD-10-CM

## 2022-12-30 DIAGNOSIS — M5412 Radiculopathy, cervical region: Secondary | ICD-10-CM

## 2022-12-30 DIAGNOSIS — M542 Cervicalgia: Secondary | ICD-10-CM

## 2023-01-13 ENCOUNTER — Other Ambulatory Visit: Payer: Self-pay

## 2023-01-13 ENCOUNTER — Other Ambulatory Visit (HOSPITAL_COMMUNITY): Payer: Self-pay

## 2023-01-19 ENCOUNTER — Other Ambulatory Visit (HOSPITAL_COMMUNITY): Payer: Self-pay

## 2023-01-23 ENCOUNTER — Other Ambulatory Visit: Payer: Self-pay | Admitting: Family

## 2023-01-23 DIAGNOSIS — M5412 Radiculopathy, cervical region: Secondary | ICD-10-CM

## 2023-01-23 DIAGNOSIS — M542 Cervicalgia: Secondary | ICD-10-CM

## 2023-01-23 DIAGNOSIS — M25511 Pain in right shoulder: Secondary | ICD-10-CM

## 2023-01-24 ENCOUNTER — Encounter: Payer: Self-pay | Admitting: Family

## 2023-01-24 NOTE — Telephone Encounter (Signed)
[facility] NTBS last OV 12/01/21

## 2023-01-24 NOTE — Telephone Encounter (Signed)
LEFT NOTE ON VOICEMAIL TO SCHEDULE APPT AND WILL MAIL LETTER

## 2023-02-02 ENCOUNTER — Telehealth: Payer: Self-pay | Admitting: Family

## 2023-02-02 DIAGNOSIS — M5412 Radiculopathy, cervical region: Secondary | ICD-10-CM

## 2023-02-02 DIAGNOSIS — M25511 Pain in right shoulder: Secondary | ICD-10-CM

## 2023-02-02 DIAGNOSIS — M542 Cervicalgia: Secondary | ICD-10-CM

## 2023-02-02 NOTE — Telephone Encounter (Signed)
  Prescription Request  02/02/2023  Is this a "Controlled Substance" medicine? Yes but first availabe appt is may 6 and pt scheduled  Have you seen your PCP in the last 2 weeks? No   If YES, route message to pool  -  If NO, patient needs to be scheduled for appointment.  What is the name of the medication or equipment? diclofenac (VOLTAREN) 75 MG EC tablet [[id number]]   Have you contacted your pharmacy to request a refill? yes   Which pharmacy would you like this sent to? [facility]    Patient notified that their request is being sent to the clinical staff for review and that they should receive a response within 2 business days.

## 2023-02-04 MED ORDER — DICLOFENAC SODIUM 75 MG PO TBEC: 75.0000 mg | DELAYED_RELEASE_TABLET | Freq: Two times a day (BID) | ORAL | 2 refills | Status: DC

## 2023-02-04 NOTE — Telephone Encounter (Signed)
Refill sent to pharmacy.   

## 2023-02-08 ENCOUNTER — Telehealth: Payer: Self-pay

## 2023-02-08 NOTE — Telephone Encounter (Signed)
Sonyia Muro (Key: [id number]) PA Case ID #: [id number] Rx #: [id number] Need Help? Call us at [phone] Status sent iconSent to Plan today Drug Diclofenac Sodium  dr tablets ePA cloud logo Form [facility] [facility] [place] Form [phone] NCPDP)

## 2023-02-11 NOTE — Telephone Encounter (Signed)
Ketra Duchesne (Key: [id number]) PA Case ID #: [id number] Rx #: [id number] Need Help? Call us at [phone] Outcome Approved on April 23 PA Case: [id number], Status: Approved, Coverage Starts on: 02/08/2023 12:00:00 AM, Coverage Ends on: 02/08/2024 12:00:00 AM. Authorization Expiration Date: 02/07/2024 Drug Diclofenac Sodium 75MG  dr tablets ePA cloud logo Form [facility] [place] Form [phone] NCPDP)  Pharmacy informed

## 2023-02-15 ENCOUNTER — Other Ambulatory Visit (HOSPITAL_COMMUNITY): Payer: Self-pay

## 2023-02-16 ENCOUNTER — Ambulatory Visit (INDEPENDENT_AMBULATORY_CARE_PROVIDER_SITE_OTHER): Payer: Medicaid Other

## 2023-02-16 ENCOUNTER — Ambulatory Visit: Payer: Medicaid Other | Attending: Internal Medicine | Admitting: Internal Medicine

## 2023-02-16 ENCOUNTER — Encounter: Payer: Self-pay | Admitting: Internal Medicine

## 2023-02-16 ENCOUNTER — Other Ambulatory Visit (HOSPITAL_COMMUNITY): Payer: Self-pay

## 2023-02-16 VITALS — BP 105/74 | HR 82 | Resp 12 | Ht 66.0 in | Wt 125.4 lb

## 2023-02-16 DIAGNOSIS — M059 Rheumatoid arthritis with rheumatoid factor, unspecified: Secondary | ICD-10-CM

## 2023-02-16 DIAGNOSIS — M79641 Pain in right hand: Secondary | ICD-10-CM | POA: Diagnosis not present

## 2023-02-16 DIAGNOSIS — M79642 Pain in left hand: Secondary | ICD-10-CM

## 2023-02-16 DIAGNOSIS — G8929 Other chronic pain: Secondary | ICD-10-CM

## 2023-02-16 DIAGNOSIS — M25512 Pain in left shoulder: Secondary | ICD-10-CM | POA: Diagnosis not present

## 2023-02-16 DIAGNOSIS — Z79899 Other long term (current) drug therapy: Secondary | ICD-10-CM

## 2023-02-16 NOTE — Progress Notes (Signed)
Office Visit Note  Patient: Michelle Ortega             Date of Birth: 31-Jan-1971           MRN: [id number]             PCP: Junie Spencer, FNP Referring: Junie Spencer, FNP Visit Date: 02/16/2023   Subjective:  Follow-up   History of Present Illness: Michelle Ortega is a 52 y.o. female here for follow up for sero positive RA on MTX 15 mg PO weekly and folic acid 1 mg daily and Humira 40 mg Pace q14days started after last visit. She has not noticed any problems with the injections besides not keeping on pressure and only partially injecting the first dose. Hand pain and swelling is improved much more than on the methotrexate. Left shoulder pain and stiffness did not improved after intraarticular steroid injection.   Previous HPI 12/17/22 Michelle Ortega is a 52 y.o. female here for follow up for seropositive RA on methotrexate that was increased to 20 mg p.o. weekly after last visit and folic acid 1 mg daily.  She has not seen much more improvement with persistent right wrist pain and swelling and left shoulder pain are most problematic areas.  Joint elsewhere are doing better.  Since increasing the methotrexate dose she feels more sick she has had to take it on the weekend and is overall dissatisfied with the benefit on higher dose.  She has not had any serious infections since her last visit.   Previous HPI 09/17/22 Michelle Ortega is a 52 y.o. female here for follow up for seropositive RA after starting methotrexate 15 mg PO weekly and folic acid 1 mg daily.  Since starting methotrexate she has seen a decrease in joint swelling there is still a little bit ongoing at the right wrist.  However still has joint pain and stiffness in multiple areas despite the improvement in swelling.  Left shoulder pain is bothering her with some limited range of motion due to pain going more towards the neck and upper back and shoulder.  She had 1 episode of nausea lasting for about 2 days after her third dose of  methotrexate but has taken the medicine 5 times total with no problem on the other 4 occasions.   Previous HPI 07/26/22 Michelle Ortega is a 52 y.o. female here for evaluation of positive rheumatoid factor associated with multiple joint pains. Xrays of cervical spine and shoulders in May showing mild degenerative changes.  Neck and shoulder pain has been going on for least about 2 years in duration.  She first noticed significant increase in pains while in a long car ride to [place] started having more pain and stiffness that did not fully resolve despite multiple days passing.  As time went on also got increased symptoms affecting her hands and wrists with pain and swelling that started to severely limit activity.  Initially had concern about tickborne illness or infectious cause but did not have any rashes no history of suspicious bite exposures.  Initially symptoms improved but had waxing and waning episodes or flareups over time going on for most of the time.  She has had increased problems in the past 1 year mostly with her hands and wrists becoming the greater problem.  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  She has had previous treatments with anti-inflammatory medications but has never  been on drug specifically for rheumatoid arthritis.  Most recently had a good improvement with diclofenac but does not completely resolve the pain and swelling.  She had previous shoulder injection with steroids reported that these were extremely painful experiences was more helpful on the right side than the left.  She has a family history of rheumatoid arthritis reports multiple aunts and grandmother. She is a long-term current cigarette smoker.   Labs reviewed 11/2021 RF 483 ESR 115 Uric acid 3.7   Review of Systems  Constitutional:  Positive for fatigue.  HENT:  Negative for mouth sores and mouth dryness.   Eyes:  Positive for dryness.   Respiratory:  Negative for shortness of breath.   Cardiovascular:  Positive for chest pain and palpitations.  Gastrointestinal:  Positive for constipation and diarrhea. Negative for blood in stool.  Endocrine: Negative for increased urination.  Genitourinary:  Positive for involuntary urination.  Musculoskeletal:  Positive for joint pain, joint pain, joint swelling, myalgias, muscle tenderness and myalgias. Negative for gait problem, muscle weakness and morning stiffness.  Skin:  Positive for sensitivity to sunlight. Negative for color change, rash and hair loss.  Allergic/Immunologic: Negative for susceptible to infections.  Neurological:  Negative for dizziness and headaches.  Hematological:  Negative for swollen glands.  Psychiatric/Behavioral:  Positive for depressed mood. Negative for sleep disturbance. The patient is nervous/anxious.     PMFS History:  Patient Active Problem List   Diagnosis Date Noted   Pain in left shoulder 02/16/2023   Seropositive rheumatoid arthritis (HCC) 07/26/2022   High risk medication use 07/26/2022   Radiculopathy, cervical region 07/26/2022   Bilateral hand pain 07/26/2022   Chest wall pain 07/26/2022   Tobacco abuse 09/14/2021   PAF (paroxysmal atrial fibrillation) (HCC) 09/14/2021   Tachycardia 07/21/2021   GAD (generalized anxiety disorder) 02/07/2015   Vitamin D deficiency 01/22/2015   Depression 01/20/2015   Pre-syncope 06/06/2013   Vertigo 06/06/2013   Headache(784.0) 06/06/2013   Hypothyroidism 06/06/2013    Past Medical History:  Diagnosis Date   A-fib (HCC)    Anxiety    Depression    Mild degeneration of cervical intervertebral disc    Pott's disease    Thyroid disease     Family History  Problem Relation Age of Onset   Hypothyroidism Mother    Hypertension Mother    Diabetes Father    Heart disease Father    Kidney disease Father    Diabetes Sister 70       MI   Heart attack Sister    Diabetes Sister    Diabetes  Brother    Depression Daughter    Healthy Son    Past Surgical History:  Procedure Laterality Date   BREAST LUMPECTOMY     KNEE SURGERY Left    TONSILLECTOMY     Social History   Social History Narrative   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    Immunization History  Administered Date(s) Administered   Influenza,inj,Quad PF,6+ Mos 07/27/2014   Td 02/07/2015   Tdap 02/07/2015, 06/20/2020     Objective: Vital Signs: BP 105/74 (BP Location: Left Arm, Patient Position: Sitting, Cuff Size: Normal)   Pulse 82   Resp 12   Ht 5\' 6"  (1.676 m)   Wt 125 lb 6.4 oz (56.9 kg)   BMI 20.24 kg/m    Physical Exam Cardiovascular:     Rate and Rhythm: Normal rate and regular rhythm.  Pulmonary:     Effort: Pulmonary  effort is normal.     Breath sounds: Normal breath sounds.  Lymphadenopathy:     Cervical: No cervical adenopathy.  Skin:    General: Skin is warm and dry.  Neurological:     Mental Status: She is alert.  Psychiatric:        Mood and Affect: Mood normal.      Musculoskeletal Exam:  Right shoulder normal left shoulder decreased abduction and external rotation ROM, crepitus, tenderness on lateral and posterior sides Elbows full ROM no tenderness or swelling Wrists full ROM right wrist prominent ulnar styloid and mild tenderness, left normal Fingers full ROM no tenderness or swelling Knees full ROM no tenderness or swelling  CDAI Exam: CDAI Score: 6  Patient Global: 30 mm; Provider Global: 10 mm Swollen: 0 ; Tender: 2  Joint Exam 02/16/2023      Right  Left  Glenohumeral      Tender  Wrist   Tender        Investigation: No additional findings.  Imaging: No results found.  Recent Labs: Lab Results  Component Value Date   WBC 10.6 12/17/2022   HGB 13.0 12/17/2022   PLT 325 12/17/2022   NA 137 12/17/2022   K 4.0 12/17/2022   CL 104 12/17/2022   CO2 24 12/17/2022   GLUCOSE 99 12/17/2022   BUN 12 12/17/2022   CREATININE 0.49 (L) 12/17/2022    BILITOT 0.2 12/17/2022   ALKPHOS 130 (H) 12/01/2021   AST 13 12/17/2022   ALT 8 12/17/2022   PROT 7.5 12/17/2022   ALBUMIN 3.8 12/01/2021   CALCIUM 9.4 12/17/2022   GFRAA 131 01/20/2015   QFTBGOLDPLUS NEGATIVE 07/26/2022    Speciality Comments: No specialty comments available.  Procedures:  No procedures performed Allergies: Erythromycin and Penicillins   Assessment / Plan:     Visit Diagnoses: Seropositive rheumatoid arthritis (HCC) - Plan: Sedimentation rate Bilateral hand pain  Hand pain appears to be doing better still relatively recent start on Humira so may see additional benefit on longer treatment.  Checking sedimentation rate for disease activity monitoring.  Left shoulder is still problematic.  Has right wrist tenderness without palpable effusion on exam today.  Plan to continue methotrexate 15 mg p.o. weekly folic acid 1 mg daily and Humira 40 mg subcu q. 14 days.  High risk medication use - Plan: CBC with Differential/Platelet, COMPLETE METABOLIC PANEL WITH GFR  Checking CBC and CMP for medication monitoring on methotrexate and Humira.  So far tolerating medication without particular intolerance.  Chronic left shoulder pain - Plan: XR Shoulder Left  Persistent left shoulder pain without definite synovitis check x-ray for this today shows extensive disease with erosive changes that fits with her history of longstanding seropositive RA.  Will need to see how much relief she gets with additional control of inflammatory disease unfortunately had limited response to prior steroid injection.  Orders: Orders Placed This Encounter  Procedures   XR Shoulder Left   Sedimentation rate   CBC with Differential/Platelet   COMPLETE METABOLIC PANEL WITH GFR   No orders of the defined types were placed in this encounter.    Follow-Up Instructions: No follow-ups on file.   Fuller Plan, MD  Note - This record has been created using [employer].  Chart creation  errors have been sought, but may not always  have been located. Such creation errors do not reflect on  the standard of medical care.

## 2023-02-17 LAB — CBC WITH DIFFERENTIAL/PLATELET
Absolute Monocytes: 515 cells/uL (ref 200–950)
Basophils Absolute: 46 cells/uL (ref 0–200)
Basophils Relative: 0.5 %
Eosinophils Absolute: 37 cells/uL (ref 15–500)
Eosinophils Relative: 0.4 %
HCT: 39.8 % (ref 35.0–45.0)
Hemoglobin: 13.6 g/dL (ref 11.7–15.5)
Lymphs Abs: 4066 cells/uL — ABNORMAL HIGH (ref 850–3900)
MCH: 31.8 pg (ref 27.0–33.0)
MCHC: 34.2 g/dL (ref 32.0–36.0)
MCV: 93 fL (ref 80.0–100.0)
MPV: 10.2 fL (ref 7.5–12.5)
Monocytes Relative: 5.6 %
Neutro Abs: 4536 cells/uL (ref 1500–7800)
Neutrophils Relative %: 49.3 %
Platelets: 293 10*3/uL (ref 140–400)
RBC: 4.28 10*6/uL (ref 3.80–5.10)
RDW: 13.4 % (ref 11.0–15.0)
Total Lymphocyte: 44.2 %
WBC: 9.2 10*3/uL (ref 3.8–10.8)

## 2023-02-17 LAB — COMPLETE METABOLIC PANEL WITH GFR
AG Ratio: 1.6 (calc) (ref 1.0–2.5)
ALT: 9 U/L (ref 6–29)
AST: 13 U/L (ref 10–35)
Albumin: 4.7 g/dL (ref 3.6–5.1)
Alkaline phosphatase (APISO): 97 U/L (ref 37–153)
BUN/Creatinine Ratio: 27 (calc) — ABNORMAL HIGH (ref 6–22)
BUN: 13 mg/dL (ref 7–25)
CO2: 23 mmol/L (ref 20–32)
Calcium: 9.5 mg/dL (ref 8.6–10.4)
Chloride: 103 mmol/L (ref 98–110)
Creat: 0.49 mg/dL — ABNORMAL LOW (ref 0.50–1.03)
Globulin: 3 g/dL (calc) (ref 1.9–3.7)
Glucose, Bld: 88 mg/dL (ref 65–99)
Potassium: 4.4 mmol/L (ref 3.5–5.3)
Sodium: 137 mmol/L (ref 135–146)
Total Bilirubin: 0.4 mg/dL (ref 0.2–1.2)
Total Protein: 7.7 g/dL (ref 6.1–8.1)
eGFR: 114 mL/min/{1.73_m2} (ref >=60)

## 2023-02-17 LAB — SEDIMENTATION RATE: Sed Rate: 29 mm/h (ref 0–30)

## 2023-02-21 ENCOUNTER — Ambulatory Visit: Payer: Medicaid Other | Admitting: Family

## 2023-02-21 ENCOUNTER — Encounter: Payer: Self-pay | Admitting: Family

## 2023-02-21 VITALS — BP 118/82 | HR 106 | Temp 98.0°F | Ht 66.0 in | Wt 124.0 lb

## 2023-02-21 DIAGNOSIS — F331 Major depressive disorder, recurrent, moderate: Secondary | ICD-10-CM

## 2023-02-21 DIAGNOSIS — E559 Vitamin D deficiency, unspecified: Secondary | ICD-10-CM | POA: Diagnosis not present

## 2023-02-21 DIAGNOSIS — E039 Hypothyroidism, unspecified: Secondary | ICD-10-CM

## 2023-02-21 DIAGNOSIS — I480 Paroxysmal atrial fibrillation: Secondary | ICD-10-CM

## 2023-02-21 DIAGNOSIS — M5412 Radiculopathy, cervical region: Secondary | ICD-10-CM

## 2023-02-21 DIAGNOSIS — R000 Tachycardia, unspecified: Secondary | ICD-10-CM

## 2023-02-21 DIAGNOSIS — Z0000 Encounter for general adult medical examination without abnormal findings: Secondary | ICD-10-CM

## 2023-02-21 DIAGNOSIS — Z79899 Other long term (current) drug therapy: Secondary | ICD-10-CM

## 2023-02-21 DIAGNOSIS — M25511 Pain in right shoulder: Secondary | ICD-10-CM

## 2023-02-21 DIAGNOSIS — Z1211 Encounter for screening for malignant neoplasm of colon: Secondary | ICD-10-CM

## 2023-02-21 DIAGNOSIS — Z0001 Encounter for general adult medical examination with abnormal findings: Secondary | ICD-10-CM

## 2023-02-21 DIAGNOSIS — F411 Generalized anxiety disorder: Secondary | ICD-10-CM

## 2023-02-21 DIAGNOSIS — M059 Rheumatoid arthritis with rheumatoid factor, unspecified: Secondary | ICD-10-CM

## 2023-02-21 DIAGNOSIS — Z720 Tobacco use: Secondary | ICD-10-CM

## 2023-02-21 DIAGNOSIS — Z122 Encounter for screening for malignant neoplasm of respiratory organs: Secondary | ICD-10-CM

## 2023-02-21 MED ORDER — DICLOFENAC SODIUM 75 MG PO TBEC
75.0000 mg | DELAYED_RELEASE_TABLET | Freq: Two times a day (BID) | ORAL | 2 refills | Status: AC
Start: 2023-02-21 — End: ?

## 2023-02-21 NOTE — Patient Instructions (Signed)

## 2023-02-21 NOTE — Progress Notes (Signed)
Subjective:    Patient ID: Michelle Ortega, female    DOB: August 05, 1971, 52 y.o.   MRN: [id number]  Chief Complaint  Patient presents with   Medical Management of Chronic Issues   Pt presents to the office today for chronic follow up.   She is followed by RA every 3 months and currently taking Humira and methotrexate. .   She is followed by Cardiologists annually for A Fib and  her sister died of a MI at the age of 39 years old.  Thyroid Problem Presents for follow-up visit. Symptoms include anxiety, constipation, dry skin and fatigue. Patient reports no hoarse voice, tremors or visual change. The symptoms have been stable.  Arthritis Presents for follow-up visit. She complains of pain and stiffness. Affected locations include the left MCP, right MCP, right shoulder and left shoulder. Her pain is at a severity of 5/10. Associated symptoms include fatigue.  Anxiety Presents for follow-up visit. Symptoms include excessive worry, nervous/anxious behavior and restlessness. Symptoms occur most days. The severity of symptoms is moderate.    Depression        This is a chronic problem.  The current episode started more than 1 year ago.   The problem occurs intermittently.  Associated symptoms include fatigue, helplessness, hopelessness, restlessness and sad.  Past treatments include nothing.  Past medical history includes thyroid problem and anxiety.   Nicotine Dependence Presents for follow-up visit. Symptoms include fatigue. Her urge triggers include company of smokers. The symptoms have been stable. She smokes 1 pack of cigarettes per day.      Review of Systems  Constitutional:  Positive for fatigue.  HENT:  Negative for hoarse voice.   Gastrointestinal:  Positive for constipation.  Musculoskeletal:  Positive for arthritis and stiffness.  Neurological:  Negative for tremors.  Psychiatric/Behavioral:  Positive for depression. The patient is nervous/anxious.   All other systems reviewed  and are negative.  Family History  Problem Relation Age of Onset   Hypothyroidism Mother    Hypertension Mother    Diabetes Father    Heart disease Father    Kidney disease Father    Diabetes Sister 43       MI   Heart attack Sister    Diabetes Sister    Diabetes Brother    Depression Daughter    Healthy Son    Social History   Socioeconomic History   Marital status: Divorced    Spouse name: Not on file   Number of children: Not on file   Years of education: Not on file   Highest education level: Associate degree: occupational, Scientist, product/process development, or vocational program  Occupational History   Not on file  Tobacco Use   Smoking status: Every Day    Packs/day: 1.00    Years: 36.00    Additional pack years: 0.00    Total pack years: 36.00    Types: Cigarettes    Start date: 06/06/1988    Passive exposure: Current   Smokeless tobacco: Never  Vaping Use   Vaping Use: Never used  Substance and Sexual Activity   Alcohol use: Yes    Comment: rare   Drug use: Not Currently    Types: Marijuana   Sexual activity: Not on file  Other Topics Concern   Not on file  Social History Narrative   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    Social Determinants of Health   Financial Resource Strain: Medium Risk (02/17/2023)   Overall Financial  Resource Strain (CARDIA)    Difficulty of Paying Living Expenses: Somewhat hard  Food Insecurity: Food Insecurity Present (02/17/2023)   Hunger Vital Sign    Worried About Running Out of Food in the Last Year: Sometimes true    Ran Out of Food in the Last Year: Sometimes true  Transportation Needs: No Transportation Needs (02/17/2023)   PRAPARE - Administrator, Civil Service (Medical): No    Lack of Transportation (Non-Medical): No  Physical Activity: Sufficiently Active (02/17/2023)   Exercise Vital Sign    Days of Exercise per Week: 3 days    Minutes of Exercise per Session: 60 min  Stress: Stress Concern Present (02/17/2023)    [employer] of Occupational Health - Occupational Stress Questionnaire    Feeling of Stress : To some extent  Social Connections: Socially Isolated (02/17/2023)   Social Connection and Isolation Panel [NHANES]    Frequency of Communication with Friends and Family: Three times a week    Frequency of Social Gatherings with Friends and Family: Once a week    Attends Religious Services: Never    Database administrator or Organizations: No    Attends Engineer, structural: Not on file    Marital Status: Divorced       Objective:   Physical Exam Vitals reviewed.  Constitutional:      General: She is not in acute distress.    Appearance: She is well-developed.  HENT:     Head: Normocephalic and atraumatic.     Right Ear: Tympanic membrane normal.     Left Ear: Tympanic membrane normal.  Eyes:     Pupils: Pupils are equal, round, and reactive to light.  Neck:     Thyroid: No thyromegaly.  Cardiovascular:     Rate and Rhythm: Normal rate and regular rhythm.     Heart sounds: Normal heart sounds. No murmur heard. Pulmonary:     Effort: Pulmonary effort is normal. No respiratory distress.     Breath sounds: Normal breath sounds. No wheezing.  Abdominal:     General: Bowel sounds are normal. There is no distension.     Palpations: Abdomen is soft.     Tenderness: There is no abdominal tenderness.  Musculoskeletal:        General: No tenderness. Normal range of motion.     Cervical back: Normal range of motion and neck supple.  Skin:    General: Skin is warm and dry.  Neurological:     Mental Status: She is alert and oriented to person, place, and time.     Cranial Nerves: No cranial nerve deficit.     Deep Tendon Reflexes: Reflexes are normal and symmetric.  Psychiatric:        Behavior: Behavior normal.        Thought Content: Thought content normal.        Judgment: Judgment normal.       BP 118/82   Pulse (!) 106   Temp 98 F (36.7 C) (Temporal)   Ht 5'  6" (1.676 m)   Wt 124 lb (56.2 kg)   SpO2 97%   BMI 20.01 kg/m      Assessment & Plan:  BILLIJO YERKE comes in today with chief complaint of Medical Management of Chronic Issues   Diagnosis and orders addressed:  1. Cervical radiculopathy  2. Acute pain of both shoulders  3. Annual physical exam - Cologuard - TSH - VITAMIN D 25 Hydroxy (Vit-D  Deficiency, Fractures) - Lipid panel - Ambulatory Referral Lung Cancer Screening [facility]  4. GAD (generalized anxiety disorder)  5. Moderate episode of recurrent major depressive disorder (HCC)  6. Vitamin D deficiency - VITAMIN D 25 Hydroxy (Vit-D Deficiency, Fractures)  7. Tobacco abuse - Ambulatory Referral Lung Cancer Screening [facility]  8. Seropositive rheumatoid arthritis (HCC)  - diclofenac (VOLTAREN) 75 MG EC tablet; Take 1 tablet (75 mg total) by mouth 2 (two) times daily. (NEEDS TO BE SEEN BEFORE NEXT REFILL)  Dispense: 180 tablet; Refill: 2  9. Tachycardia  10. PAF (paroxysmal atrial fibrillation) (HCC)  11. Hypothyroidism, unspecified type - TSH  12. High risk medication use   13. Colon cancer screening - Cologuard  14. Screening for lung cancer - Ambulatory Referral Lung Cancer Screening [facility]   Continue medications  Labs pending Health Maintenance reviewed Diet and exercise encouraged  Follow up plan: 6 months    Jannifer Rodney, FNP

## 2023-02-22 ENCOUNTER — Other Ambulatory Visit: Payer: Self-pay | Admitting: Family

## 2023-02-22 LAB — LIPID PANEL
Chol/HDL Ratio: 3.6 ratio (ref 0.0–4.4)
Cholesterol, Total: 224 mg/dL — ABNORMAL HIGH (ref 100–199)
HDL: 62 mg/dL (ref >39)
LDL Chol Calc (NIH): 145 mg/dL — ABNORMAL HIGH (ref 0–99)
Triglycerides: 97 mg/dL (ref 0–149)
VLDL Cholesterol Cal: 17 mg/dL (ref 5–40)

## 2023-02-22 LAB — VITAMIN D 25 HYDROXY (VIT D DEFICIENCY, FRACTURES): Vit D, 25-Hydroxy: 21.1 ng/mL — ABNORMAL LOW (ref 30.0–100.0)

## 2023-02-22 LAB — TSH: TSH: 8.98 u[IU]/mL — ABNORMAL HIGH (ref 0.450–4.500)

## 2023-02-22 MED ORDER — LEVOTHYROXINE SODIUM 75 MCG PO TABS: 75.0000 ug | ORAL_TABLET | Freq: Every day | ORAL | 1 refills | Status: DC

## 2023-02-22 MED ORDER — VITAMIN D (ERGOCALCIFEROL) 1.25 MG (50000 UNIT) PO CAPS: 50000.0000 [IU] | ORAL_CAPSULE | ORAL | 3 refills | Status: DC

## 2023-02-23 ENCOUNTER — Other Ambulatory Visit: Payer: Self-pay | Admitting: Family

## 2023-02-23 DIAGNOSIS — Z1231 Encounter for screening mammogram for malignant neoplasm of breast: Secondary | ICD-10-CM

## 2023-02-28 ENCOUNTER — Ambulatory Visit
Admission: RE | Admit: 2023-02-28 | Discharge: 2023-02-28 | Disposition: A | Discharge status: Home / Self Care | Payer: Medicaid Other | Source: Ambulatory Visit | Attending: Family | Admitting: Family

## 2023-02-28 DIAGNOSIS — Z1231 Encounter for screening mammogram for malignant neoplasm of breast: Secondary | ICD-10-CM | POA: Diagnosis not present

## 2023-03-03 ENCOUNTER — Other Ambulatory Visit: Payer: Self-pay | Admitting: Family

## 2023-03-03 DIAGNOSIS — R928 Other abnormal and inconclusive findings on diagnostic imaging of breast: Secondary | ICD-10-CM

## 2023-03-03 NOTE — Progress Notes (Signed)
Appt had been made

## 2023-03-16 ENCOUNTER — Ambulatory Visit
Admission: RE | Admit: 2023-03-16 | Discharge: 2023-03-16 | Disposition: A | Discharge status: Home / Self Care | Payer: Medicaid Other | Source: Ambulatory Visit | Attending: Family | Admitting: Family

## 2023-03-16 ENCOUNTER — Other Ambulatory Visit (HOSPITAL_COMMUNITY): Payer: Self-pay

## 2023-03-16 DIAGNOSIS — R922 Inconclusive mammogram: Secondary | ICD-10-CM | POA: Diagnosis not present

## 2023-03-16 DIAGNOSIS — R928 Other abnormal and inconclusive findings on diagnostic imaging of breast: Secondary | ICD-10-CM

## 2023-03-18 ENCOUNTER — Other Ambulatory Visit: Payer: Self-pay

## 2023-03-18 ENCOUNTER — Other Ambulatory Visit (HOSPITAL_COMMUNITY): Payer: Self-pay

## 2023-03-18 ENCOUNTER — Other Ambulatory Visit: Payer: Self-pay | Admitting: Internal Medicine

## 2023-03-18 DIAGNOSIS — M059 Rheumatoid arthritis with rheumatoid factor, unspecified: Secondary | ICD-10-CM

## 2023-03-18 DIAGNOSIS — Z79899 Other long term (current) drug therapy: Secondary | ICD-10-CM

## 2023-03-18 MED ORDER — HUMIRA (2 PEN) 40 MG/0.4ML ~~LOC~~ AJKT
40.0000 mg | AUTO-INJECTOR | SUBCUTANEOUS | 2 refills | Status: DC
Start: 2023-03-18 — End: 2023-06-10
  Filled 2023-03-18: qty 2, 28d supply, fill #0
  Filled 2023-04-14: qty 2, 28d supply, fill #1
  Filled 2023-05-12: qty 2, 28d supply, fill #2

## 2023-03-18 NOTE — Telephone Encounter (Signed)
Last Fill: 12/29/2022  Labs: 02/16/2023 Creatinine 0.49, BUN/Creatinine Ratio 27, Lymphs Abs 4,066  TB Gold: 07/27/2023  NEGATIVE   Next Visit: 05/19/2023  Last Visit: 02/16/2023  [id number] rheumatoid arthritis   Current Dose per office note 02/16/2023: Humira 40 mg [place] q14days   Okay to refill Humira?

## 2023-04-13 ENCOUNTER — Other Ambulatory Visit (HOSPITAL_COMMUNITY): Payer: Self-pay

## 2023-04-14 ENCOUNTER — Other Ambulatory Visit (HOSPITAL_COMMUNITY): Payer: Self-pay

## 2023-04-15 ENCOUNTER — Other Ambulatory Visit (HOSPITAL_COMMUNITY): Payer: Self-pay

## 2023-04-20 ENCOUNTER — Other Ambulatory Visit: Payer: Self-pay

## 2023-04-20 ENCOUNTER — Other Ambulatory Visit (HOSPITAL_COMMUNITY): Payer: Self-pay

## 2023-04-22 ENCOUNTER — Other Ambulatory Visit: Payer: Self-pay

## 2023-04-22 ENCOUNTER — Other Ambulatory Visit (HOSPITAL_COMMUNITY): Payer: Self-pay

## 2023-04-25 ENCOUNTER — Encounter: Payer: Self-pay | Admitting: Family

## 2023-04-25 ENCOUNTER — Ambulatory Visit (INDEPENDENT_AMBULATORY_CARE_PROVIDER_SITE_OTHER): Payer: Medicaid Other | Admitting: Family

## 2023-04-25 VITALS — BP 128/83 | HR 97 | Temp 97.6°F | Ht 66.0 in | Wt 128.4 lb

## 2023-04-25 DIAGNOSIS — Z720 Tobacco use: Secondary | ICD-10-CM

## 2023-04-25 DIAGNOSIS — E039 Hypothyroidism, unspecified: Secondary | ICD-10-CM

## 2023-04-25 NOTE — Patient Instructions (Signed)

## 2023-04-25 NOTE — Progress Notes (Signed)
   Subjective:    Patient ID: Michelle Ortega, female    DOB: Jul 29, 1971, 52 y.o.   MRN: [id number]  Chief Complaint  Patient presents with   Hypothyroidism   PT presents to the office today to recheck TSH. She was seen on 02/21/23 and we increased her levothyroxine to 75 mcg from 50 mcg. Doing well.  Thyroid Problem Presents for follow-up visit. Symptoms include constipation and fatigue. Patient reports no anxiety or diarrhea. The symptoms have been stable.  Nicotine Dependence Presents for follow-up visit. Symptoms include fatigue. Her urge triggers include company of smokers. She smokes < 1/2 a pack of cigarettes per day.      Review of Systems  Constitutional:  Positive for fatigue.  Gastrointestinal:  Positive for constipation. Negative for diarrhea.  Psychiatric/Behavioral:  The patient is not nervous/anxious.   All other systems reviewed and are negative.      Objective:   Physical Exam Vitals reviewed.  Constitutional:      General: She is not in acute distress.    Appearance: She is well-developed.  HENT:     Head: Normocephalic and atraumatic.  Eyes:     Pupils: Pupils are equal, round, and reactive to light.  Neck:     Thyroid: No thyromegaly.  Cardiovascular:     Rate and Rhythm: Normal rate and regular rhythm.     Heart sounds: Normal heart sounds. No murmur heard. Pulmonary:     Effort: Pulmonary effort is normal. No respiratory distress.     Breath sounds: Normal breath sounds. No wheezing.  Abdominal:     General: Bowel sounds are normal. There is no distension.     Palpations: Abdomen is soft.     Tenderness: There is no abdominal tenderness.  Musculoskeletal:        General: No tenderness. Normal range of motion.     Cervical back: Normal range of motion and neck supple.  Skin:    General: Skin is warm and dry.  Neurological:     Mental Status: She is alert and oriented to person, place, and time.     Cranial Nerves: No cranial nerve deficit.      Deep Tendon Reflexes: Reflexes are normal and symmetric.  Psychiatric:        Behavior: Behavior normal.        Thought Content: Thought content normal.        Judgment: Judgment normal.      BP 128/83   Pulse 97   Temp 97.6 F (36.4 C) (Temporal)   Ht 5\' 6"  (1.676 m)   Wt 128 lb 6.4 oz (58.2 kg)   BMI 20.72 kg/m       Assessment & Plan:  AREON KAMMER comes in today with chief complaint of Hypothyroidism   Diagnosis and orders addressed:  1. Hypothyroidism, unspecified type - TSH  2. Tobacco abuse   Labs pending Continue current medications  Health Maintenance reviewed Diet and exercise encouraged  Follow up plan: 6 months   Jannifer Rodney, FNP

## 2023-04-26 ENCOUNTER — Other Ambulatory Visit (HOSPITAL_COMMUNITY): Payer: Self-pay

## 2023-04-26 ENCOUNTER — Other Ambulatory Visit: Payer: Self-pay | Admitting: Family

## 2023-04-26 LAB — TSH: TSH: 5.2 u[IU]/mL — ABNORMAL HIGH (ref 0.450–4.500)

## 2023-04-26 MED ORDER — LEVOTHYROXINE SODIUM 88 MCG PO TABS: 88.0000 ug | ORAL_TABLET | Freq: Every day | ORAL | 1 refills | Status: DC

## 2023-05-10 ENCOUNTER — Other Ambulatory Visit (HOSPITAL_COMMUNITY): Payer: Self-pay

## 2023-05-12 ENCOUNTER — Other Ambulatory Visit (HOSPITAL_COMMUNITY): Payer: Self-pay

## 2023-05-13 NOTE — Progress Notes (Signed)
Office Visit Note  Patient: Michelle Ortega             Date of Birth: Oct 20, 1970           MRN: [id number]             PCP: Junie Spencer, FNP Referring: Junie Spencer, FNP Visit Date: 05/19/2023   Subjective:  Follow-up   History of Present Illness: Michelle Ortega is a 52 y.o. female here for follow up for sero positive RA on MTX 15 mg PO weekly and folic acid 1 mg daily and Humira 40 mg [place] q14 days. Symptoms have been doing pretty well with no flare and minimal joint swelling. No infections or problems taking her medication. Left shoulder bothers her a lot cannot raise overhead and gets pain lying on her side. Also pain on right hand 4th-5th fingers mostly.  Previous HPI 02/16/2023 Michelle Ortega is a 52 y.o. female here for follow up for sero positive RA on MTX 15 mg PO weekly and folic acid 1 mg daily and Humira 40 mg [place] q14days started after last visit. She has not noticed any problems with the injections besides not keeping on pressure and only partially injecting the first dose. Hand pain and swelling is improved much more than on the methotrexate. Left shoulder pain and stiffness did not improved after intraarticular steroid injection.    Previous HPI 12/17/22 Michelle Ortega is a 52 y.o. female here for follow up for seropositive RA on methotrexate that was increased to 20 mg p.o. weekly after last visit and folic acid 1 mg daily.  She has not seen much more improvement with persistent right wrist pain and swelling and left shoulder pain are most problematic areas.  Joint elsewhere are doing better.  Since increasing the methotrexate dose she feels more sick she has had to take it on the weekend and is overall dissatisfied with the benefit on higher dose.  She has not had any serious infections since her last visit.   Previous HPI 09/17/22 Michelle Ortega is a 52 y.o. female here for follow up for seropositive RA after starting methotrexate 15 mg PO weekly and folic acid 1 mg  daily.  Since starting methotrexate she has seen a decrease in joint swelling there is still a little bit ongoing at the right wrist.  However still has joint pain and stiffness in multiple areas despite the improvement in swelling.  Left shoulder pain is bothering her with some limited range of motion due to pain going more towards the neck and upper back and shoulder.  She had 1 episode of nausea lasting for about 2 days after her third dose of methotrexate but has taken the medicine 5 times total with no problem on the other 4 occasions.   Previous HPI 07/26/22 Michelle Ortega is a 52 y.o. female here for evaluation of positive rheumatoid factor associated with multiple joint pains. Xrays of cervical spine and shoulders in May showing mild degenerative changes.  Neck and shoulder pain has been going on for least about 2 years in duration.  She first noticed significant increase in pains while in a long car ride to [place] started having more pain and stiffness that did not fully resolve despite multiple days passing.  As time went on also got increased symptoms affecting her hands and wrists with pain and swelling that started to severely limit activity.  Initially had concern about tickborne illness or infectious cause  but did not have any rashes no history of suspicious bite exposures.  Initially symptoms improved but had waxing and waning episodes or flareups over time going on for most of the time.  She has had increased problems in the past 1 year mostly with her hands and wrists becoming the greater problem.  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  She has had previous treatments with anti-inflammatory medications but has never been on drug specifically for rheumatoid arthritis.  Most recently had a good improvement with diclofenac but does not completely resolve the pain and swelling.  She had previous shoulder injection with steroids reported  that these were extremely painful experiences was more helpful on the right side than the left.  She has a family history of rheumatoid arthritis reports multiple aunts and grandmother. She is a long-term current cigarette smoker.   Labs reviewed 11/2021 RF 483 ESR 115 Uric acid 3.7   Review of Systems  Constitutional:  Positive for fatigue.  HENT:  Negative for mouth sores and mouth dryness.   Eyes:  Positive for dryness.  Respiratory:  Negative for shortness of breath.   Cardiovascular:  Negative for chest pain and palpitations.  Gastrointestinal:  Positive for constipation and diarrhea. Negative for blood in stool.  Endocrine: Negative for increased urination.  Genitourinary:  Positive for involuntary urination.  Musculoskeletal:  Positive for joint pain, joint pain, joint swelling, myalgias, morning stiffness, muscle tenderness and myalgias. Negative for gait problem and muscle weakness.  Skin:  Positive for hair loss and sensitivity to sunlight. Negative for color change and rash.  Allergic/Immunologic: Positive for susceptible to infections.  Neurological:  Negative for dizziness and headaches.  Hematological:  Negative for swollen glands.  Psychiatric/Behavioral:  Negative for depressed mood and sleep disturbance. The patient is not nervous/anxious.     PMFS History:  Patient Active Problem List   Diagnosis Date Noted   Pain in left shoulder 02/16/2023   Seropositive rheumatoid arthritis (HCC) 07/26/2022   High risk medication use 07/26/2022   Radiculopathy, cervical region 07/26/2022   Bilateral hand pain 07/26/2022   Chest wall pain 07/26/2022   Tobacco abuse 09/14/2021   PAF (paroxysmal atrial fibrillation) (HCC) 09/14/2021   Tachycardia 07/21/2021   GAD (generalized anxiety disorder) 02/07/2015   Vitamin D deficiency 01/22/2015   Depression 01/20/2015   Headache(784.0) 06/06/2013   Hypothyroidism 06/06/2013    Past Medical History:  Diagnosis Date   A-fib (HCC)     Anxiety    Depression    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    Immunization History  Administered Date(s) Administered   Influenza,inj,Quad PF,6+ Mos 07/27/2014   Td 02/07/2015  Tdap 02/07/2015, 06/20/2020     Objective: Vital Signs: BP 120/81 (BP Location: Left Arm, Patient Position: Sitting, Cuff Size: Normal)   Pulse (!) 101   Resp 14   Ht 5' 6.25" (1.683 m)   Wt 128 lb (58.1 kg)   BMI 20.50 kg/m    Physical Exam Eyes:     Conjunctiva/sclera: Conjunctivae normal.  Cardiovascular:     Rate and Rhythm: Normal rate and regular rhythm.  Pulmonary:     Effort: Pulmonary effort is normal.     Breath sounds: Normal breath sounds.  Musculoskeletal:     Right lower leg: No edema.     Left lower leg: No edema.  Lymphadenopathy:     Cervical: No cervical adenopathy.  Skin:    General: Skin is warm and dry.     Findings: No rash.  Neurological:     Mental Status: She is alert.  Psychiatric:        Mood and Affect: Mood normal.      Musculoskeletal Exam:  Left shoulder decreased abduction range of motion and external rotation, crepitus with active and passive movement, tenderness to pressure with no palpable swelling Elbows full ROM no tenderness or swelling Right wrist prominent ulnar styloid and slight ulnar subluxation, very mildly tender to pressure on ulnar side no appreciable swelling Fingers full  ROM mild tenderness on dorsal side at right fourth and fifth MCPs no nodules or swelling, grip range of motion is intact Knees full ROM no tenderness or swelling Feels some cramping or soreness on the plantar side at MTPs and on bottom arch of foot with plantarflexion, no swelling no MTP squeeze tenderness   CDAI Exam: CDAI Score: 8  Patient Global: 30 / 100; Provider Global: 10 / 100 Swollen: 0 ; Tender: 4  Joint Exam 05/19/2023      Right  Left  Glenohumeral      Tender  Wrist   Tender     MCP 4   Tender     MCP 5   Tender        Investigation: No additional findings.  Imaging: No results found.  Recent Labs: Lab Results  Component Value Date   WBC 8.4 05/19/2023   HGB 14.0 05/19/2023   PLT 298 05/19/2023   NA 137 05/19/2023   K 4.1 05/19/2023   CL 103 05/19/2023   CO2 22 05/19/2023   GLUCOSE 81 05/19/2023   BUN 15 05/19/2023   CREATININE 0.62 05/19/2023   BILITOT 0.4 05/19/2023   ALKPHOS 130 (H) 12/01/2021   AST 15 05/19/2023   ALT 14 05/19/2023   PROT 7.4 05/19/2023   ALBUMIN 3.8 12/01/2021   CALCIUM 9.8 05/19/2023   GFRAA 131 01/20/2015   QFTBGOLDPLUS NEGATIVE 07/26/2022    Speciality Comments: No specialty comments available.  Procedures:  No procedures performed Allergies: Erythromycin and Penicillins   Assessment / Plan:     Visit Diagnoses: Seropositive rheumatoid arthritis (HCC) - Plan: Sedimentation rate  Joint inflammation appears well-controlled exam findings are mostly from residual chronic degenerative changes especially at the right wrist and left shoulder.  Will recheck sedimentation rate for disease activity monitoring.  Plan to continue Humira 40 mg subcu q. 14 days methotrexate 15 mg p.o. weekly folic acid 1 mg daily.  High risk medication use - methotrexate 15 mg p.o. weekly folic acid 1 mg daily and Humira 40 mg subcu q. 14 days. - Plan: CBC with Differential/Platelet, COMPLETE METABOLIC PANEL WITH GFR  Checking CBC and  CMP for  medication monitoring on Humira and methotrexate.  No serious interval infections.  Chronic left shoulder pain - XR Shoulder Left shows extensive disease with erosive changes that fits with her history of longstanding seropositive RA - Plan: Ambulatory referral to Orthopedic Surgery  I believe there is significant glenohumeral joint osteoarthritis as well as any coexisting rotator cuff arthropathy that may be contributing.  Symptoms have not improved much which controlling her RA so I think it is more chronic damage.  Previously tried physical therapy and steroid injections with her orthopedist Dr. Hilda Lias without much benefit.  She is interested in other options will refer to Aldean Baker for evaluation.  Orders: Orders Placed This Encounter  Procedures   Sedimentation rate   CBC with Differential/Platelet   COMPLETE METABOLIC PANEL WITH GFR   Ambulatory referral to Orthopedic Surgery   No orders of the defined types were placed in this encounter.    Follow-Up Instructions: Return in about 3 months (around 08/19/2023) for RA on MTX/ADA f/u 3mos.   Fuller Plan, MD  Note - This record has been created using [employer].  Chart creation errors have been sought, but may not always  have been located. Such creation errors do not reflect on  the standard of medical care.

## 2023-05-16 ENCOUNTER — Other Ambulatory Visit: Payer: Self-pay

## 2023-05-17 ENCOUNTER — Other Ambulatory Visit: Payer: Self-pay

## 2023-05-19 ENCOUNTER — Ambulatory Visit: Payer: Medicaid Other | Attending: Internal Medicine | Admitting: Internal Medicine

## 2023-05-19 ENCOUNTER — Encounter: Payer: Self-pay | Admitting: Internal Medicine

## 2023-05-19 ENCOUNTER — Other Ambulatory Visit (HOSPITAL_COMMUNITY): Payer: Self-pay

## 2023-05-19 VITALS — BP 120/81 | HR 101 | Resp 14 | Ht 66.25 in | Wt 128.0 lb

## 2023-05-19 DIAGNOSIS — M79642 Pain in left hand: Secondary | ICD-10-CM

## 2023-05-19 DIAGNOSIS — Z79899 Other long term (current) drug therapy: Secondary | ICD-10-CM

## 2023-05-19 DIAGNOSIS — M059 Rheumatoid arthritis with rheumatoid factor, unspecified: Secondary | ICD-10-CM | POA: Diagnosis not present

## 2023-05-19 DIAGNOSIS — M25512 Pain in left shoulder: Secondary | ICD-10-CM

## 2023-05-19 DIAGNOSIS — G8929 Other chronic pain: Secondary | ICD-10-CM

## 2023-05-19 DIAGNOSIS — M79641 Pain in right hand: Secondary | ICD-10-CM | POA: Diagnosis not present

## 2023-05-19 LAB — COMPLETE METABOLIC PANEL WITH GFR
AG Ratio: 1.6 (calc) (ref 1.0–2.5)
ALT: 14 U/L (ref 6–29)
AST: 15 U/L (ref 10–35)
Albumin: 4.5 g/dL (ref 3.6–5.1)
Alkaline phosphatase (APISO): 85 U/L (ref 37–153)
BUN: 15 mg/dL (ref 7–25)
CO2: 22 mmol/L (ref 20–32)
Calcium: 9.8 mg/dL (ref 8.6–10.4)
Chloride: 103 mmol/L (ref 98–110)
Creat: 0.62 mg/dL (ref 0.50–1.03)
Globulin: 2.9 g/dL (calc) (ref 1.9–3.7)
Glucose, Bld: 81 mg/dL (ref 65–99)
Potassium: 4.1 mmol/L (ref 3.5–5.3)
Sodium: 137 mmol/L (ref 135–146)
Total Bilirubin: 0.4 mg/dL (ref 0.2–1.2)
Total Protein: 7.4 g/dL (ref 6.1–8.1)
eGFR: 108 mL/min/{1.73_m2} (ref >=60)

## 2023-05-20 NOTE — Progress Notes (Signed)
Sedimentation rate is in normal range at 22 slightly further down from 29.  Blood count and metabolic panel normal so no problem with the Humira and methotrexate.

## 2023-06-06 ENCOUNTER — Ambulatory Visit: Payer: Medicaid Other | Admitting: Orthopedic Surgery

## 2023-06-06 DIAGNOSIS — G8929 Other chronic pain: Secondary | ICD-10-CM

## 2023-06-06 DIAGNOSIS — M25512 Pain in left shoulder: Secondary | ICD-10-CM

## 2023-06-07 ENCOUNTER — Encounter: Payer: Self-pay | Admitting: Orthopedic Surgery

## 2023-06-07 NOTE — Progress Notes (Signed)
Office Visit Note   Patient: Michelle Ortega           Date of Birth: 01/05/1971           MRN: [id number] Visit Date: 06/06/2023 Requested by: Fuller Plan, MD [address] [place],  [place] [address] PCP: Junie Spencer, FNP  Subjective: Chief Complaint  Patient presents with   Left Shoulder - Pain    HPI: Michelle Ortega is a 52 y.o. female who presents to the office reporting bilateral shoulder pain left worse than right.  She states that her shoulders have been "hurting forever".  She did have an injection without much relief.  She is right-hand dominant.  The pain wakes her from sleep most nights.  She reports significant popping on that left-hand side along with cracking.  Describes decreased range of motion.  She has to assist her left arm with the right arm.  She does have rheumatoid arthritis.  Taking methotrexate as well as Humira.  The methotrexate is going to be decreased.  She has a daughter at home.  Also takes Voltaren.  Currently not working..                ROS: All systems reviewed are negative as they relate to the chief complaint within the history of present illness.  Patient denies fevers or chills.  Assessment & Plan: Visit Diagnoses:  1. 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  Plan is reverse shoulder replacement.  She will have to have her immunosuppressive medications managed in the perioperative period.  The risk and benefits of reverse shoulder replacement are discussed with the patient including not limited to infection or vessel damage instability incomplete pain relief as well as incomplete functional restoration.  The rehabilitation is also discussed.  All questions answered.  Follow-Up Instructions: No follow-ups on file.   Orders:  Orders Placed This Encounter  Procedures   CT SHOULDER LEFT WO CONTRAST   No orders of the  defined types were placed in this encounter.     Procedures: No procedures performed   Clinical Data: No additional findings.  Objective: Vital Signs: There were no vitals taken for this visit.  Physical Exam:  Constitutional: Patient appears well-developed HEENT:  Head: Normocephalic Eyes:EOM are normal Neck: Normal range of motion Cardiovascular: Normal rate Pulmonary/chest: Effort normal Neurologic: Patient is alert Skin: Skin is warm Psychiatric: Patient has normal mood and affect  Ortho Exam: Ortho exam demonstrates pretty reasonable cervical spine range of motion flexion chin to chest extension 40 degrees rotation 60 degrees bilaterally.  Patient has 5 out of 5 grip EPL FPL interosseous wrist flexion extension bicep triceps and deltoid strength.  She does have some external rotation weakness at 4-5 on the left compared to the right.  Subscap strength 5 out of 5 on the left 5+ out of 5 on the right.  Significant coarseness grinding and crepitus present on the left side much more than the right.  Range of motion on the left is 50/85/165 passively.  Range of motion on the right is 60/105/170.  She does have some scarring on the left forearm region from prior dog bite.  She does have occasional paresthesias in this region.  Specialty Comments:  No specialty comments available.  Imaging: No results found.   PMFS History: Patient Active Problem List   Diagnosis Date Noted  Pain in left shoulder 02/16/2023   Seropositive rheumatoid arthritis (HCC) 07/26/2022   High risk medication use 07/26/2022   Radiculopathy, cervical region 07/26/2022   Bilateral hand pain 07/26/2022   Chest wall pain 07/26/2022   Tobacco abuse 09/14/2021   PAF (paroxysmal atrial fibrillation) (HCC) 09/14/2021   Tachycardia 07/21/2021   GAD (generalized anxiety disorder) 02/07/2015   Vitamin D deficiency 01/22/2015   Depression 01/20/2015   Headache(784.0) 06/06/2013   Hypothyroidism 06/06/2013    Past Medical History:  Diagnosis Date   A-fib (HCC)    Anxiety    Depression    Mild degeneration of cervical intervertebral disc    Pott's disease    Thyroid disease     Family History  Problem Relation Age of Onset   Hypothyroidism Mother    Hypertension Mother    Diabetes Father    Heart disease Father    Kidney disease Father    Diabetes Sister 50       MI   Heart attack Sister    Diabetes Sister    Diabetes Brother    Depression Daughter    Healthy Son     Past Surgical History:  Procedure Laterality Date   BREAST LUMPECTOMY     KNEE SURGERY Left    TONSILLECTOMY     Social History   Occupational History   Not on file  Tobacco Use   Smoking status: Every Day    Current packs/day: 1.00    Average packs/day: 1 pack/day for 36.0 years (36.0 ttl pk-yrs)    Types: Cigarettes    Start date: 06/06/1988    Passive exposure: Current   Smokeless tobacco: Never  Vaping Use   Vaping status: Never Used  Substance and Sexual Activity   Alcohol use: Yes    Comment: rare   Drug use: Not Currently    Types: Marijuana   Sexual activity: Not on file

## 2023-06-10 ENCOUNTER — Other Ambulatory Visit: Payer: Self-pay

## 2023-06-10 ENCOUNTER — Other Ambulatory Visit: Payer: Self-pay | Admitting: Internal Medicine

## 2023-06-10 DIAGNOSIS — M059 Rheumatoid arthritis with rheumatoid factor, unspecified: Secondary | ICD-10-CM

## 2023-06-10 DIAGNOSIS — Z79899 Other long term (current) drug therapy: Secondary | ICD-10-CM

## 2023-06-10 MED ORDER — HUMIRA (2 PEN) 40 MG/0.4ML ~~LOC~~ AJKT
40.0000 mg | AUTO-INJECTOR | SUBCUTANEOUS | 2 refills | Status: DC
  Filled 2023-06-10: qty 2, 28d supply, fill #0
  Filled 2023-07-22: qty 2, 28d supply, fill #1
  Filled 2023-08-22: qty 2, 28d supply, fill #2

## 2023-06-10 NOTE — Telephone Encounter (Signed)
Last Fill: 03/18/2023  Labs: 05/19/2023 Sedimentation rate is in normal range at 22 slightly further down from 29.  Blood count and metabolic panel normal so no problem with the Humira and methotrexate.   TB Gold: 07/26/2022 Negative   Next Visit: 08/19/2023  Last Visit: 05/19/2023  DX: Seropositive rheumatoid arthritis    Current Dose per office note 05/19/2023: Humira 40 mg subcu q. 14 days   Okay to refill Humira?

## 2023-06-11 ENCOUNTER — Other Ambulatory Visit (HOSPITAL_COMMUNITY): Payer: Self-pay

## 2023-06-13 ENCOUNTER — Other Ambulatory Visit (HOSPITAL_COMMUNITY): Payer: Self-pay

## 2023-06-14 ENCOUNTER — Other Ambulatory Visit (HOSPITAL_COMMUNITY): Payer: Self-pay

## 2023-06-16 ENCOUNTER — Encounter: Payer: Self-pay | Admitting: Orthopedic Surgery

## 2023-06-21 ENCOUNTER — Encounter: Payer: Self-pay | Admitting: Orthopedic Surgery

## 2023-06-23 ENCOUNTER — Ambulatory Visit
Admission: RE | Admit: 2023-06-23 | Discharge: 2023-06-23 | Disposition: A | Discharge status: Home / Self Care | Payer: Medicaid Other | Source: Ambulatory Visit | Attending: Orthopedic Surgery | Admitting: Orthopedic Surgery

## 2023-06-23 DIAGNOSIS — G8929 Other chronic pain: Secondary | ICD-10-CM

## 2023-06-23 DIAGNOSIS — M25512 Pain in left shoulder: Secondary | ICD-10-CM | POA: Diagnosis not present

## 2023-06-23 DIAGNOSIS — M19012 Primary osteoarthritis, left shoulder: Secondary | ICD-10-CM | POA: Diagnosis not present

## 2023-07-12 ENCOUNTER — Other Ambulatory Visit: Payer: Self-pay

## 2023-07-22 ENCOUNTER — Other Ambulatory Visit: Payer: Self-pay

## 2023-07-22 NOTE — Progress Notes (Signed)
     Specialty Pharmacy Refill Coordination Note  Michelle Ortega is a 52 y.o. female contacted today regarding refills of specialty medication(s) Adalimumab   Patient requested Delivery   Delivery date: 07/26/23   Verified address: [address] [place]  [address]   Medication will be filled on 07/25/23.

## 2023-07-25 ENCOUNTER — Other Ambulatory Visit (HOSPITAL_COMMUNITY): Payer: Self-pay

## 2023-08-05 NOTE — Progress Notes (Deleted)
Office Visit Note  Patient: Michelle Ortega             Date of Birth: October 02, 1971           MRN: [id number]             PCP: Junie Spencer, FNP Referring: Junie Spencer, FNP Visit Date: 08/19/2023   Subjective:  No chief complaint on file.   History of Present Illness: Michelle Ortega is a 52 y.o. female here for follow up for sero positive RA on MTX 15 mg PO weekly and folic acid 1 mg daily and Humira 40 mg [place] q14 days.    Previous HPI 05/19/2023 Michelle Ortega is a 52 y.o. female here for follow up for sero positive RA on MTX 15 mg PO weekly and folic acid 1 mg daily and Humira 40 mg [place] q14 days. Symptoms have been doing pretty well with no flare and minimal joint swelling. No infections or problems taking her medication. Left shoulder bothers her a lot cannot raise overhead and gets pain lying on her side. Also pain on right hand 4th-5th fingers mostly.   Previous HPI 02/16/2023 Michelle Ortega is a 52 y.o. female here for follow up for sero positive RA on MTX 15 mg PO weekly and folic acid 1 mg daily and Humira 40 mg [place] q14days started after last visit. She has not noticed any problems with the injections besides not keeping on pressure and only partially injecting the first dose. Hand pain and swelling is improved much more than on the methotrexate. Left shoulder pain and stiffness did not improved after intraarticular steroid injection.    Previous HPI 12/17/22 Michelle Ortega is a 52 y.o. female here for follow up for seropositive RA on methotrexate that was increased to 20 mg p.o. weekly after last visit and folic acid 1 mg daily.  She has not seen much more improvement with persistent right wrist pain and swelling and left shoulder pain are most problematic areas.  Joint elsewhere are doing better.  Since increasing the methotrexate dose she feels more sick she has had to take it on the weekend and is overall dissatisfied with the benefit on higher dose.  She has not had any serious  infections since her last visit.   Previous HPI 09/17/22 Michelle Ortega is a 52 y.o. female here for follow up for seropositive RA after starting methotrexate 15 mg PO weekly and folic acid 1 mg daily.  Since starting methotrexate she has seen a decrease in joint swelling there is still a little bit ongoing at the right wrist.  However still has joint pain and stiffness in multiple areas despite the improvement in swelling.  Left shoulder pain is bothering her with some limited range of motion due to pain going more towards the neck and upper back and shoulder.  She had 1 episode of nausea lasting for about 2 days after her third dose of methotrexate but has taken the medicine 5 times total with no problem on the other 4 occasions.   Previous HPI 07/26/22 Michelle Ortega is a 52 y.o. female here for evaluation of positive rheumatoid factor associated with multiple joint pains. Xrays of cervical spine and shoulders in May showing mild degenerative changes.  Neck and shoulder pain has been going on for least about 2 years in duration.  She first noticed significant increase in pains while in a long car ride to [place] started having more  pain and stiffness that did not fully resolve despite multiple days passing.  As time went on also got increased symptoms affecting her hands and wrists with pain and swelling that started to severely limit activity.  Initially had concern about tickborne illness or infectious cause but did not have any rashes no history of suspicious bite exposures.  Initially symptoms improved but had waxing and waning episodes or flareups over time going on for most of the time.  She has had increased problems in the past 1 year mostly with her hands and wrists becoming the greater problem.  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  She has had previous treatments with anti-inflammatory medications but has never been on drug  specifically for rheumatoid arthritis.  Most recently had a good improvement with diclofenac but does not completely resolve the pain and swelling.  She had previous shoulder injection with steroids reported that these were extremely painful experiences was more helpful on the right side than the left.  She has a family history of rheumatoid arthritis reports multiple aunts and grandmother. She is a long-term current cigarette smoker.   Labs reviewed 11/2021 RF 483 ESR 115 Uric acid 3.7   No Rheumatology ROS completed.   PMFS History:  Patient Active Problem List   Diagnosis Date Noted   Pain in left shoulder 02/16/2023   Seropositive rheumatoid arthritis (HCC) 07/26/2022   High risk medication use 07/26/2022   Radiculopathy, cervical region 07/26/2022   Bilateral hand pain 07/26/2022   Chest wall pain 07/26/2022   Tobacco abuse 09/14/2021   PAF (paroxysmal atrial fibrillation) (HCC) 09/14/2021   Tachycardia 07/21/2021   GAD (generalized anxiety disorder) 02/07/2015   Vitamin D deficiency 01/22/2015   Depression 01/20/2015   Headache 06/06/2013   Hypothyroidism 06/06/2013    Past Medical History:  Diagnosis Date   A-fib (HCC)    Anxiety    Depression    Mild degeneration of cervical intervertebral disc    Pott's disease    Thyroid disease     Family History  Problem Relation Age of Onset   Hypothyroidism Mother    Hypertension Mother    Diabetes Father    Heart disease Father    Kidney disease Father    Diabetes Sister 23       MI   Heart attack Sister    Diabetes Sister    Diabetes Brother    Depression Daughter    Healthy Son    Past Surgical History:  Procedure Laterality Date   BREAST LUMPECTOMY     KNEE SURGERY Left    TONSILLECTOMY     Social History   Social History Narrative   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    Immunization History  Administered Date(s) Administered   Influenza,inj,Quad PF,6+ Mos 07/27/2014   Td 02/07/2015    Tdap 02/07/2015, 06/20/2020     Objective: Vital Signs: There were no vitals taken for this visit.   Physical Exam   Musculoskeletal Exam: ***  CDAI Exam: CDAI Score: -- Patient Global: --; Provider Global: -- Swollen: --; Tender: -- Joint Exam 08/19/2023   No joint exam has been documented for this visit   There is currently no information documented on the homunculus. Go to the Rheumatology activity and complete the homunculus joint exam.  Investigation: No additional findings.  Imaging: No results found.  Recent Labs: Lab Results  Component Value Date   WBC  8.4 05/19/2023   HGB 14.0 05/19/2023   PLT 298 05/19/2023   NA 137 05/19/2023   K 4.1 05/19/2023   CL 103 05/19/2023   CO2 22 05/19/2023   GLUCOSE 81 05/19/2023   BUN 15 05/19/2023   CREATININE 0.62 05/19/2023   BILITOT 0.4 05/19/2023   ALKPHOS 130 (H) 12/01/2021   AST 15 05/19/2023   ALT 14 05/19/2023   PROT 7.4 05/19/2023   ALBUMIN 3.8 12/01/2021   CALCIUM 9.8 05/19/2023   GFRAA 131 01/20/2015   QFTBGOLDPLUS NEGATIVE 07/26/2022    Speciality Comments: No specialty comments available.  Procedures:  No procedures performed Allergies: Erythromycin and Penicillins   Assessment / Plan:     Visit Diagnoses: No diagnosis found.  ***  Orders: No orders of the defined types were placed in this encounter.  No orders of the defined types were placed in this encounter.    Follow-Up Instructions: No follow-ups on file.   Metta Clines, RT  Note - This record has been created using [employer].  Chart creation errors have been sought, but may not always  have been located. Such creation errors do not reflect on  the standard of medical care.

## 2023-08-19 ENCOUNTER — Ambulatory Visit: Payer: Medicaid Other | Admitting: Internal Medicine

## 2023-08-19 DIAGNOSIS — G8929 Other chronic pain: Secondary | ICD-10-CM

## 2023-08-19 DIAGNOSIS — M059 Rheumatoid arthritis with rheumatoid factor, unspecified: Secondary | ICD-10-CM

## 2023-08-19 DIAGNOSIS — Z79899 Other long term (current) drug therapy: Secondary | ICD-10-CM

## 2023-08-21 ENCOUNTER — Other Ambulatory Visit: Payer: Self-pay | Admitting: Medical Genetics

## 2023-08-21 DIAGNOSIS — Z006 Encounter for examination for normal comparison and control in clinical research program: Secondary | ICD-10-CM

## 2023-08-22 ENCOUNTER — Other Ambulatory Visit: Payer: Self-pay

## 2023-08-22 ENCOUNTER — Ambulatory Visit: Payer: Medicaid Other | Attending: Internal Medicine | Admitting: Internal Medicine

## 2023-08-22 ENCOUNTER — Other Ambulatory Visit (HOSPITAL_COMMUNITY): Payer: Self-pay

## 2023-08-22 ENCOUNTER — Encounter: Payer: Self-pay | Admitting: Internal Medicine

## 2023-08-22 VITALS — BP 124/82 | HR 111 | Resp 14 | Ht 66.25 in | Wt 131.0 lb

## 2023-08-22 DIAGNOSIS — M059 Rheumatoid arthritis with rheumatoid factor, unspecified: Secondary | ICD-10-CM | POA: Diagnosis not present

## 2023-08-22 DIAGNOSIS — M25512 Pain in left shoulder: Secondary | ICD-10-CM

## 2023-08-22 DIAGNOSIS — G8929 Other chronic pain: Secondary | ICD-10-CM

## 2023-08-22 DIAGNOSIS — Z79899 Other long term (current) drug therapy: Secondary | ICD-10-CM

## 2023-08-22 DIAGNOSIS — R911 Solitary pulmonary nodule: Secondary | ICD-10-CM | POA: Insufficient documentation

## 2023-08-22 DIAGNOSIS — Z720 Tobacco use: Secondary | ICD-10-CM

## 2023-08-22 NOTE — Progress Notes (Signed)
Office Visit Note  Patient: Michelle Ortega             Date of Birth: 1971-06-02           MRN: [id number]             PCP: Junie Spencer, FNP Referring: Junie Spencer, FNP Visit Date: 08/22/2023   Subjective:  Follow-up   History of Present Illness: Michelle Ortega is a 52 y.o. female here for follow up for seropositive RA on Humira 40 mg [place] q. 14 days and methotrexate 15 mg p.o. weekly and folic acid 1 mg daily.  Overall her joints are doing pretty well except for ongoing shoulder pain.  Still has pain with use and decreased mobility of her left shoulder.  She went for CT scan in September that showed severe glenohumeral joint osteoarthritis also incidentally identified 9 mm left upper lobe calcified nodule.  She has not experienced any new chest pain shortness of breath or new cough.  She has a follow-up appointment with her PCP later this week.  She is currently waiting they recommended up to 8 weeks for developing her shoulder model before scheduling for surgery.  Previous HPI 05/19/23 Michelle Ortega is a 52 y.o. female here for follow up for sero positive RA on MTX 15 mg PO weekly and folic acid 1 mg daily and Humira 40 mg [place] q14 days. Symptoms have been doing pretty well with no flare and minimal joint swelling. No infections or problems taking her medication. Left shoulder bothers her a lot cannot raise overhead and gets pain lying on her side. Also pain on right hand 4th-5th fingers mostly.   Previous HPI 02/16/2023 Michelle Ortega is a 52 y.o. female here for follow up for sero positive RA on MTX 15 mg PO weekly and folic acid 1 mg daily and Humira 40 mg [place] q14days started after last visit. She has not noticed any problems with the injections besides not keeping on pressure and only partially injecting the first dose. Hand pain and swelling is improved much more than on the methotrexate. Left shoulder pain and stiffness did not improved after intraarticular steroid injection.     Previous HPI 12/17/22 Michelle Ortega is a 52 y.o. female here for follow up for seropositive RA on methotrexate that was increased to 20 mg p.o. weekly after last visit and folic acid 1 mg daily.  She has not seen much more improvement with persistent right wrist pain and swelling and left shoulder pain are most problematic areas.  Joint elsewhere are doing better.  Since increasing the methotrexate dose she feels more sick she has had to take it on the weekend and is overall dissatisfied with the benefit on higher dose.  She has not had any serious infections since her last visit.   Previous HPI 09/17/22 Michelle Ortega is a 52 y.o. female here for follow up for seropositive RA after starting methotrexate 15 mg PO weekly and folic acid 1 mg daily.  Since starting methotrexate she has seen a decrease in joint swelling there is still a little bit ongoing at the right wrist.  However still has joint pain and stiffness in multiple areas despite the improvement in swelling.  Left shoulder pain is bothering her with some limited range of motion due to pain going more towards the neck and upper back and shoulder.  She had 1 episode of nausea lasting for about 2 days after her third  dose of methotrexate but has taken the medicine 5 times total with no problem on the other 4 occasions.   Previous HPI 07/26/22 Michelle Ortega is a 52 y.o. female here for evaluation of positive rheumatoid factor associated with multiple joint pains. Xrays of cervical spine and shoulders in May showing mild degenerative changes.  Neck and shoulder pain has been going on for least about 2 years in duration.  She first noticed significant increase in pains while in a long car ride to [place] started having more pain and stiffness that did not fully resolve despite multiple days passing.  As time went on also got increased symptoms affecting her hands and wrists with pain and swelling that started to severely limit activity.  Initially had  concern about tickborne illness or infectious cause but did not have any rashes no history of suspicious bite exposures.  Initially symptoms improved but had waxing and waning episodes or flareups over time going on for most of the time.  She has had increased problems in the past 1 year mostly with her hands and wrists becoming the greater problem.  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  She has had previous treatments with anti-inflammatory medications but has never been on drug specifically for rheumatoid arthritis.  Most recently had a good improvement with diclofenac but does not completely resolve the pain and swelling.  She had previous shoulder injection with steroids reported that these were extremely painful experiences was more helpful on the right side than the left.  She has a family history of rheumatoid arthritis reports multiple aunts and grandmother. She is a long-term current cigarette smoker.   Labs reviewed 11/2021 RF 483 ESR 115 Uric acid 3.7   Review of Systems  Constitutional:  Positive for fatigue.  HENT:  Negative for mouth sores and mouth dryness.   Eyes:  Positive for dryness.  Respiratory:  Negative for shortness of breath.   Cardiovascular:  Positive for palpitations. Negative for chest pain.  Gastrointestinal:  Positive for constipation and diarrhea. Negative for blood in stool.  Endocrine: Negative for increased urination.  Genitourinary:  Positive for involuntary urination.  Musculoskeletal:  Positive for joint pain, joint pain, joint swelling, myalgias, morning stiffness and myalgias. Negative for gait problem, muscle weakness and muscle tenderness.  Skin:  Positive for sensitivity to sunlight. Negative for color change, rash and hair loss.  Allergic/Immunologic: Negative for susceptible to infections.  Neurological:  Negative for dizziness and headaches.  Hematological:  Negative for swollen glands.   Psychiatric/Behavioral:  Positive for depressed mood. Negative for sleep disturbance. The patient is not nervous/anxious.     PMFS History:  Patient Active Problem List   Diagnosis Date Noted   Lung nodule 08/22/2023   Pain in left shoulder 02/16/2023   Seropositive rheumatoid arthritis (HCC) 07/26/2022   High risk medication use 07/26/2022   Radiculopathy, cervical region 07/26/2022   Bilateral hand pain 07/26/2022   Chest wall pain 07/26/2022   Tobacco abuse 09/14/2021   PAF (paroxysmal atrial fibrillation) (HCC) 09/14/2021   Tachycardia 07/21/2021   GAD (generalized anxiety disorder) 02/07/2015   Vitamin D deficiency 01/22/2015   Depression 01/20/2015   Headache 06/06/2013   Hypothyroidism 06/06/2013    Past Medical History:  Diagnosis Date   A-fib (HCC)    Anxiety    Depression    Mild degeneration of cervical intervertebral disc    Pott's disease    Thyroid disease  Family History  Problem Relation Age of Onset   Hypothyroidism Mother    Hypertension Mother    Diabetes Father    Heart disease Father    Kidney disease Father    Diabetes Sister 86       MI   Heart attack Sister    Diabetes Sister    Diabetes Brother    Depression Daughter    Healthy Son    Past Surgical History:  Procedure Laterality Date   BREAST LUMPECTOMY     KNEE SURGERY Left    TONSILLECTOMY     Social History   Social History Narrative   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    Immunization History  Administered Date(s) Administered   Influenza,inj,Quad PF,6+ Mos 07/27/2014   Td 02/07/2015   Tdap 02/07/2015, 06/20/2020     Objective: Vital Signs: BP 124/82 (BP Location: Left Arm, Patient Position: Sitting, Cuff Size: Normal)   Pulse (!) 111   Resp 14   Ht 5' 6.25" (1.683 m)   Wt 131 lb (59.4 kg)   BMI 20.98 kg/m    Physical Exam Eyes:     Conjunctiva/sclera: Conjunctivae normal.  Cardiovascular:     Rate and Rhythm: Regular rhythm. Tachycardia present.   Pulmonary:     Effort: Pulmonary effort is normal.     Breath sounds: Wheezing present.  Musculoskeletal:     Right lower leg: No edema.     Left lower leg: No edema.  Lymphadenopathy:     Cervical: No cervical adenopathy.  Skin:    General: Skin is warm and dry.  Neurological:     Mental Status: She is alert.  Psychiatric:        Mood and Affect: Mood normal.      Musculoskeletal Exam:  Left shoulder tenderness to pressure unable to abduct significantly above head, decrease in external rotation range of motion and provokes crepitus, no palpable swelling Elbows full ROM no tenderness or swelling Right wrist with prominent ulnar styloid and mild subluxation no palpable swelling or tenderness to pressure, full range of motion intact Fingers full ROM no tenderness or swelling Knees full ROM no tenderness or swelling Ankles full ROM no tenderness or swelling   Investigation: No additional findings.  Imaging: No results found.  Recent Labs: Lab Results  Component Value Date   WBC 8.5 08/22/2023   HGB 14.1 08/22/2023   PLT 293 08/22/2023   NA 139 08/22/2023   K 3.8 08/22/2023   CL 102 08/22/2023   CO2 23 08/22/2023   GLUCOSE 91 08/22/2023   BUN 8 08/22/2023   CREATININE 0.64 08/22/2023   BILITOT 0.4 08/22/2023   ALKPHOS 130 (H) 12/01/2021   AST 14 08/22/2023   ALT 12 08/22/2023   PROT 8.0 08/22/2023   ALBUMIN 3.8 12/01/2021   CALCIUM 10.2 08/22/2023   GFRAA 131 01/20/2015   QFTBGOLDPLUS NEGATIVE 08/22/2023    Speciality Comments: No specialty comments available.  Procedures:  No procedures performed Allergies: Erythromycin and Penicillins   Assessment / Plan:     Visit Diagnoses: Seropositive rheumatoid arthritis (HCC) - Plan: Sedimentation rate  Joint inflammation appears pretty well-controlled without synovitis on exam today.  Will recheck sedimentation rate for disease activity monitoring.  If doing extremely well could consider tapering of methotrexate  dose while continuing on Humira.  Otherwise plan to continue methotrexate 15 mg p.o. weekly folic acid 1 mg daily and Humira 40 mg subcu q. 14 days.  High risk medication use -  Plan: CBC with Differential/Platelet, COMPLETE METABOLIC PANEL WITH GFR, QuantiFERON-TB Gold Plus  Checking CBC and CMP and annual QuantiFERON screening for monitoring with continued long-term use of methotrexate and Humira.  No serious interval infection.  Chronic left shoulder pain  Was seen by Dr. August Saucer with assessment including CT scan of the shoulder.  Has not heard back yet since they are planning on modeling for the reverse shoulder replacement hardware.    Lung nodule Tobacco abuse  Incidental noting of left upper lobe pulmonary nodule on the CT scan with radiologist recommendation to get full lung view with high-resolution CT especially given her smoking.  She has an appointment with her PCP Jannifer Rodney later this week who I recommend she ask to follow this up.  Orders: Orders Placed This Encounter  Procedures   Sedimentation rate   CBC with Differential/Platelet   COMPLETE METABOLIC PANEL WITH GFR   QuantiFERON-TB Gold Plus   No orders of the defined types were placed in this encounter.    Follow-Up Instructions: Return in about 3 months (around 11/22/2023) for RA on ADA/MTX f/u 3mos.   Fuller Plan, MD  Note - This record has been created using [employer].  Chart creation errors have been sought, but may not always  have been located. Such creation errors do not reflect on  the standard of medical care.

## 2023-08-22 NOTE — Progress Notes (Signed)
Specialty Pharmacy Refill Coordination Note  Michelle Ortega is a 52 y.o. female contacted today regarding refills of specialty medication(s) Adalimumab   Patient requested Delivery   Delivery date: 08/24/23   Verified address: [address] [place] [address]   Medication will be filled on 08/23/23.

## 2023-08-23 ENCOUNTER — Other Ambulatory Visit: Payer: Self-pay

## 2023-08-23 NOTE — Progress Notes (Signed)
Sedimentation rate of 34 is mildly elevated.  Blood count kidney and liver function are all normal.  There is no problem for continuing her current medications but I would not recommend trying any reduction of the methotrexate dose at this time.

## 2023-08-24 LAB — COMPLETE METABOLIC PANEL WITH GFR
AG Ratio: 1.4 (calc) (ref 1.0–2.5)
ALT: 12 U/L (ref 6–29)
AST: 14 U/L (ref 10–35)
Albumin: 4.7 g/dL (ref 3.6–5.1)
Alkaline phosphatase (APISO): 102 U/L (ref 37–153)
BUN: 8 mg/dL (ref 7–25)
CO2: 23 mmol/L (ref 20–32)
Calcium: 10.2 mg/dL (ref 8.6–10.4)
Chloride: 102 mmol/L (ref 98–110)
Creat: 0.64 mg/dL (ref 0.50–1.03)
Globulin: 3.3 g/dL (ref 1.9–3.7)
Glucose, Bld: 91 mg/dL (ref 65–99)
Potassium: 3.8 mmol/L (ref 3.5–5.3)
Sodium: 139 mmol/L (ref 135–146)
Total Bilirubin: 0.4 mg/dL (ref 0.2–1.2)
Total Protein: 8 g/dL (ref 6.1–8.1)
eGFR: 106 mL/min/{1.73_m2} (ref >=60)

## 2023-08-24 LAB — CBC WITH DIFFERENTIAL/PLATELET
Absolute Lymphocytes: 2916 {cells}/uL (ref 850–3900)
Absolute Monocytes: 570 {cells}/uL (ref 200–950)
Basophils Absolute: 34 {cells}/uL (ref 0–200)
Basophils Relative: 0.4 %
Eosinophils Absolute: 68 {cells}/uL (ref 15–500)
Eosinophils Relative: 0.8 %
HCT: 41.4 % (ref 35.0–45.0)
Hemoglobin: 14.1 g/dL (ref 11.7–15.5)
MCH: 31.9 pg (ref 27.0–33.0)
MCHC: 34.1 g/dL (ref 32.0–36.0)
MCV: 93.7 fL (ref 80.0–100.0)
MPV: 10.6 fL (ref 7.5–12.5)
Monocytes Relative: 6.7 %
Neutro Abs: 4913 {cells}/uL (ref 1500–7800)
Neutrophils Relative %: 57.8 %
Platelets: 293 10*3/uL (ref 140–400)
RBC: 4.42 10*6/uL (ref 3.80–5.10)
RDW: 11.8 % (ref 11.0–15.0)
Total Lymphocyte: 34.3 %
WBC: 8.5 10*3/uL (ref 3.8–10.8)

## 2023-08-24 LAB — QUANTIFERON-TB GOLD PLUS
Mitogen-NIL: >10 [IU]/mL
NIL: 0.03 [IU]/mL
QuantiFERON-TB Gold Plus: NEGATIVE
TB1-NIL: 0 [IU]/mL
TB2-NIL: 0.01 [IU]/mL

## 2023-08-24 LAB — SEDIMENTATION RATE: Sed Rate: 34 mm/h — ABNORMAL HIGH (ref 0–30)

## 2023-08-25 ENCOUNTER — Encounter: Payer: Self-pay | Admitting: Family

## 2023-08-25 ENCOUNTER — Ambulatory Visit: Payer: Medicaid Other | Admitting: Family

## 2023-08-25 VITALS — BP 116/75 | HR 84 | Temp 98.0°F | Ht 66.25 in | Wt 133.6 lb

## 2023-08-25 DIAGNOSIS — E559 Vitamin D deficiency, unspecified: Secondary | ICD-10-CM | POA: Diagnosis not present

## 2023-08-25 DIAGNOSIS — R911 Solitary pulmonary nodule: Secondary | ICD-10-CM

## 2023-08-25 DIAGNOSIS — I480 Paroxysmal atrial fibrillation: Secondary | ICD-10-CM

## 2023-08-25 DIAGNOSIS — E039 Hypothyroidism, unspecified: Secondary | ICD-10-CM

## 2023-08-25 DIAGNOSIS — Z720 Tobacco use: Secondary | ICD-10-CM | POA: Diagnosis not present

## 2023-08-25 DIAGNOSIS — F411 Generalized anxiety disorder: Secondary | ICD-10-CM | POA: Diagnosis not present

## 2023-08-25 DIAGNOSIS — F331 Major depressive disorder, recurrent, moderate: Secondary | ICD-10-CM

## 2023-08-25 NOTE — Progress Notes (Signed)
Subjective:    Patient ID: Michelle Ortega, female    DOB: 04-12-1971, 51 y.o.   MRN: [id number]  Chief Complaint  Patient presents with   Medical Management of Chronic Issues    Needs ct lung    Pt presents to the office today for chronic follow up.    She is followed by Rheumatologists for  RA every 3 months and currently taking Humira and methotrexate.   Her Rheumatologists ordered a CT shoulder on 06/23/23 that showed, "1. Severe osteoarthritis of the glenohumeral joint. 2. Mild arthropathy of the acromioclavicular joint. 3. A 9 mm subpleural left upper lobe pulmonary nodule with a small area of eccentric calcification within the nodule likely reflecting a focal area of nodular scarring. Left solid pulmonary nodule within the upper lobe measuring 9 mm detected on incomplete chest CT. Per Fleischner Society Guidelines, recommend prompt non-contrast Chest CT for further evaluation. These guidelines do not apply to immunocompromised patients and patients with cancer. Follow up in patients with significant comorbidities as clinically warranted. For lung cancer screening, adhere to Lung-RADS guidelines. Reference: Radiology. 2017; 284(1):228-43."  Will order CT chest today.    She is followed by Cardiologists annually for A Fib and  her sister died of a MI at the age of 81 years old.  Thyroid Problem Presents for follow-up visit. Symptoms include anxiety, constipation, dry skin and fatigue. Patient reports no hoarse voice. The symptoms have been stable.  Arthritis Presents for follow-up visit. She complains of pain and stiffness. Affected locations include the left MCP, right MCP, right knee and left knee. Her pain is at a severity of 5/10. Associated symptoms include fatigue.  Anxiety Presents for follow-up visit. Symptoms include excessive worry, nervous/anxious behavior and restlessness. Symptoms occur occasionally. The severity of symptoms is mild.    Depression        This  is a chronic problem.  The current episode started more than 1 year ago.   The problem occurs intermittently.  Associated symptoms include fatigue, restlessness and sad.  Associated symptoms include no helplessness and no hopelessness.  Past treatments include nothing.  Past medical history includes thyroid problem and anxiety.       Review of Systems  Constitutional:  Positive for fatigue.  HENT:  Negative for hoarse voice.   Gastrointestinal:  Positive for constipation.  Musculoskeletal:  Positive for arthritis and stiffness.  Psychiatric/Behavioral:  Positive for depression. The patient is nervous/anxious.   All other systems reviewed and are negative.      Objective:   Physical Exam Vitals reviewed.  Constitutional:      General: She is not in acute distress.    Appearance: She is well-developed.  HENT:     Head: Normocephalic and atraumatic.     Right Ear: Tympanic membrane normal.     Left Ear: Tympanic membrane normal.  Eyes:     Pupils: Pupils are equal, round, and reactive to light.  Neck:     Thyroid: No thyromegaly.  Cardiovascular:     Rate and Rhythm: Normal rate and regular rhythm.     Heart sounds: Normal heart sounds. No murmur heard. Pulmonary:     Effort: Pulmonary effort is normal. No respiratory distress.     Breath sounds: Normal breath sounds. No wheezing.  Abdominal:     General: Bowel sounds are normal. There is no distension.     Palpations: Abdomen is soft.     Tenderness: There is no abdominal tenderness.  Musculoskeletal:  General: No tenderness. Normal range of motion.     Cervical back: Normal range of motion and neck supple.  Skin:    General: Skin is warm and dry.  Neurological:     Mental Status: She is alert and oriented to person, place, and time.     Cranial Nerves: No cranial nerve deficit.     Deep Tendon Reflexes: Reflexes are normal and symmetric.  Psychiatric:        Behavior: Behavior normal.        Thought Content:  Thought content normal.        Judgment: Judgment normal.       BP 116/75   Pulse 84   Temp 98 F (36.7 C) (Temporal)   Ht 5' 6.25" (1.683 m)   Wt 133 lb 9.6 oz (60.6 kg)   SpO2 95%   BMI 21.40 kg/m      Assessment & Plan:  Michelle Ortega comes in today with chief complaint of Medical Management of Chronic Issues (Needs ct lung )   Diagnosis and orders addressed:  1. Lung nodule CT chest pending  - CT CHEST WO CONTRAST; Future  2. Tobacco abuse Smoking cessation discussed   3. GAD (generalized anxiety disorder)  4. Vitamin D deficiency  5. Hypothyroidism, unspecified type - TSH  6. Moderate episode of recurrent major depressive disorder (HCC)  7. PAF (paroxysmal atrial fibrillation) (HCC)   Labs pending and labs reviewed  Continue current medications and specialists  Health Maintenance reviewed Diet and exercise encouraged  Follow up plan: 6 months    Jannifer Rodney, FNP

## 2023-08-25 NOTE — Patient Instructions (Signed)
Pulmonary Nodule  A pulmonary nodule is a small, round growth of tissue in the lung. It is sometimes referred to as a shadow or a spot on the lung. Nodules can vary in size, and most measure less than  of an inch (10 mm). Nodules bigger than 1.2 inches (3 cm) are called lung masses. A pulmonary nodule is sometimes found during a routine chest X-ray or while other imaging tests are done to check for other problems. Pulmonary nodules can be either noncancerous (benign) or cancerous (malignant). Most are noncancerous. Smaller nodules in people who do not smoke and who do not have any other risk factors for lung cancer are more likely to be noncancerous. Larger, irregular nodules in people who smoke or who have a strong family history of lung cancer are more likely to be cancerous. What are the causes? This condition may be caused by: A bacterial, fungal, or viral infection, such as tuberculosis. The infection is usually an old and inactive one. Cancerous tissue, such as lung cancer or a cancer in another part of the body that has spread to the lung. A noncancerous mass of tissue. Inflammation from conditions such as rheumatoid arthritis. Abnormal blood vessels in the lungs. What are the signs or symptoms? Usually, there are no symptoms of this condition. If symptoms appear, they are usually related to the underlying cause. For example, if the condition is caused by an infection, you may have a cough or a fever. How is this diagnosed? This condition is usually diagnosed with an X-ray or CT scan. To help determine whether a pulmonary nodule is benign or malignant, your health care provider will: Take your medical history. Perform a physical exam. Order tests. These may include: Chest X-rays. A CT scan. This test shows smaller pulmonary nodules more clearly and with more detail than an X-ray. A positron emission tomography (PET) scan. This test is done to check if a nodule is cancerous. During the  test, a small amount of a radioactive substance is injected into the bloodstream. Then a picture is taken. Biopsy to evaluate for cancer or confirm a diagnosis. This procedure involves removing a tissue sample from the nodule by inserting a needle through the chest, placing a scope down into the lung, or doing open surgery. A skin test called a tuberculin test. This test is done to check if you have been exposed to the germ that causes tuberculosis. Blood tests. How is this treated? Treatment for this condition depends on whether the pulmonary nodule is malignant or benign. It also depends on your risk of getting cancer. Noncancerous nodules usually do not need to be treated, but they may need to be monitored with CT scans. If a CT scan shows that the pulmonary nodule got bigger, more tests may be done. Nodules that are found to be cancerous after a biopsy require treatment depending on the type and stage of the cancer. You will need more diagnostic tests, such as CT and PET scans, to determine the stage of the cancer. Treatments for cancer can include: Surgery. Radiation therapy. Chemotherapy. Immunotherapy. Some nodules need to be removed. If you need a nodule removed, you may have a procedure called a thoracotomy. During the procedure, your health care provider will make an incision in your chest and remove the part of the lung where the nodule is located. Follow these instructions at home: Take over-the-counter and prescription medicines only as told by your health care provider. Do not use any products that contain  nicotine or tobacco. These products include cigarettes, chewing tobacco, and vaping devices, such as e-cigarettes. If you need help quitting, ask your health care provider. Keep all follow-up visits. This is important. Contact a health care provider if: You have pain in your chest, back, or shoulder. You are short of breath or have trouble breathing when you are active. You  develop a cough, or you develop hoarseness for an unexplained reason. You feel sick or unusually tired. You do not feel like eating or you lose weight without trying. You develop chills or night sweats. You need two or more pillows to sleep on at night. You have any of these problems: A fever and symptoms that suddenly get worse. A fever or persistent symptoms for more than 2-3 days. Get help right away if: You cannot catch your breath. You have sudden chest pain. You start making high-pitched whistling sounds when you breathe, most often when you breathe out (you wheeze). You cannot stop coughing, or you cough up blood or bloody mucus from your lungs (sputum). You become dizzy or feel like you may faint. These symptoms may represent a serious problem that is an emergency. Do not wait to see if the symptoms will go away. Get medical help right away. Call your local emergency services (911 in the [place] Do not drive yourself to the hospital. Summary A pulmonary nodule is a small, round growth of tissue in the lung. Most pulmonary nodules are noncancerous. Common causes of pulmonary nodules include infection, inflammation, and noncancerous growths. This condition is usually diagnosed with an X-ray or CT scan. Treatment for this condition depends on whether the pulmonary nodule is malignant or benign. It also depends on your risk of getting cancer. If a nodule is found to be cancerous, you will need specific diagnostic tests and treatment options as told by your health care provider. This information is not intended to replace advice given to you by your health care provider. Make sure you discuss any questions you have with your health care provider. Document Revised: 04/23/2020 Document Reviewed: 04/23/2020 Elsevier Patient Education  2024 [employer].

## 2023-08-26 LAB — TSH: TSH: 3.16 u[IU]/mL (ref 0.450–4.500)

## 2023-09-05 ENCOUNTER — Ambulatory Visit (HOSPITAL_COMMUNITY)
Admission: RE | Admit: 2023-09-05 | Discharge: 2023-09-05 | Disposition: A | Discharge status: Home / Self Care | Payer: Medicaid Other | Source: Ambulatory Visit | Attending: Family | Admitting: Family

## 2023-09-05 DIAGNOSIS — R911 Solitary pulmonary nodule: Secondary | ICD-10-CM | POA: Diagnosis not present

## 2023-09-05 DIAGNOSIS — J984 Other disorders of lung: Secondary | ICD-10-CM | POA: Diagnosis not present

## 2023-09-05 DIAGNOSIS — R918 Other nonspecific abnormal finding of lung field: Secondary | ICD-10-CM | POA: Diagnosis not present

## 2023-09-09 ENCOUNTER — Other Ambulatory Visit: Payer: Self-pay

## 2023-09-13 ENCOUNTER — Other Ambulatory Visit: Payer: Self-pay

## 2023-09-14 ENCOUNTER — Other Ambulatory Visit: Payer: Self-pay

## 2023-09-14 ENCOUNTER — Telehealth: Payer: Self-pay | Admitting: Internal Medicine

## 2023-09-14 DIAGNOSIS — Z79899 Other long term (current) drug therapy: Secondary | ICD-10-CM

## 2023-09-14 DIAGNOSIS — M059 Rheumatoid arthritis with rheumatoid factor, unspecified: Secondary | ICD-10-CM

## 2023-09-14 NOTE — Progress Notes (Signed)
Specialty Pharmacy Refill Coordination Note  Michelle Ortega is a 52 y.o. female contacted today regarding refills of specialty medication(s) Adalimumab   Patient requested Delivery   Delivery date: 09/20/23   Verified address: [address] [place] [address]   Medication will be filled on 09/20/23.  Refill request pending.

## 2023-09-14 NOTE — Telephone Encounter (Signed)
Last Fill: 06/10/2023  Labs: 08/22/2023 Sedimentation rate of 34 is mildly elevated.  Blood count kidney and liver function are all normal.  There is no problem for continuing her current medications but I would not recommend trying any reduction of the methotrexate dose at this time.   TB Gold: 08/22/2023 Negative   Next Visit: 11/25/2023  Last Visit: 08/22/2023  [id number] rheumatoid arthritis   Current Dose per office note 08/22/2023: Humira 40 mg subcu q. 14 days.   Okay to refill Humira?

## 2023-09-19 ENCOUNTER — Other Ambulatory Visit: Payer: Self-pay

## 2023-09-20 ENCOUNTER — Other Ambulatory Visit: Payer: Self-pay | Admitting: Internal Medicine

## 2023-09-20 ENCOUNTER — Other Ambulatory Visit: Payer: Self-pay

## 2023-09-20 DIAGNOSIS — Z79899 Other long term (current) drug therapy: Secondary | ICD-10-CM

## 2023-09-20 DIAGNOSIS — M059 Rheumatoid arthritis with rheumatoid factor, unspecified: Secondary | ICD-10-CM

## 2023-09-20 NOTE — Telephone Encounter (Signed)
Refill was requested again. Please advise.

## 2023-09-22 ENCOUNTER — Other Ambulatory Visit: Payer: Self-pay | Admitting: *Deleted

## 2023-09-22 ENCOUNTER — Other Ambulatory Visit: Payer: Self-pay

## 2023-09-22 ENCOUNTER — Other Ambulatory Visit (HOSPITAL_COMMUNITY): Payer: Self-pay

## 2023-09-22 DIAGNOSIS — Z79899 Other long term (current) drug therapy: Secondary | ICD-10-CM

## 2023-09-22 DIAGNOSIS — M059 Rheumatoid arthritis with rheumatoid factor, unspecified: Secondary | ICD-10-CM

## 2023-09-22 MED ORDER — HUMIRA (2 PEN) 40 MG/0.4ML ~~LOC~~ AJKT
40.0000 mg | AUTO-INJECTOR | SUBCUTANEOUS | 2 refills | Status: DC
  Filled 2023-09-23: qty 2, 28d supply, fill #0
  Filled 2023-10-17: qty 2, 28d supply, fill #1
  Filled 2023-11-05 – 2023-11-15 (×2): qty 2, 28d supply, fill #2

## 2023-09-22 NOTE — Telephone Encounter (Signed)
 Last Fill: 06/10/2023  Labs: 08/22/2023 Sedimentation rate of 34 is mildly elevated.  Blood count kidney and liver function are all normal.  There is no problem for continuing her current medications but I would not recommend trying any reduction of the methotrexate dose at this time.   TB Gold: 08/22/2023 Negative   Next Visit: 11/25/2023  Last Visit: 08/22/2023  [id number] rheumatoid arthritis   Current Dose per office note 08/22/2023: Humira 40 mg subcu q. 14 days.   Okay to refill Humira?

## 2023-09-22 NOTE — Progress Notes (Signed)
LVM - refill request denied.

## 2023-09-23 ENCOUNTER — Other Ambulatory Visit: Payer: Self-pay

## 2023-09-26 ENCOUNTER — Other Ambulatory Visit: Payer: Self-pay

## 2023-09-27 ENCOUNTER — Encounter: Payer: Self-pay | Admitting: *Deleted

## 2023-09-27 ENCOUNTER — Other Ambulatory Visit: Payer: Self-pay | Admitting: Family

## 2023-09-27 ENCOUNTER — Telehealth: Payer: Self-pay | Admitting: Family Medicine

## 2023-09-27 NOTE — Telephone Encounter (Signed)
Copied from CRM [id number]. Topic: Clinical - Lab/Test Results >> Sep 26, 2023  4:52 PM Clayton Bibles wrote: Reason for CRM: She is returning a call - She wants information about CT scan - she did not understand info in My Chart

## 2023-09-27 NOTE — Telephone Encounter (Signed)
Can you advise more on the CT scan please?

## 2023-09-27 NOTE — Telephone Encounter (Signed)
Called and discussed findings with patient.

## 2023-09-29 ENCOUNTER — Other Ambulatory Visit (HOSPITAL_COMMUNITY): Payer: Self-pay

## 2023-09-29 ENCOUNTER — Other Ambulatory Visit: Payer: Self-pay

## 2023-09-29 NOTE — Progress Notes (Signed)
Patient returned our call and would like to pick it up at [facility] tomorrow. Since we have had multiple issues with [facility] delivering to her residence I confirmed that [employer] can deliver there which patient states they do regularly. Will attempt to use UPS for next delivery. Patient states the road leading to her residence is difficult for small cars.

## 2023-09-29 NOTE — Progress Notes (Signed)
LVM- [facility] returned package to [facility]. Per [facility] road conditions at patient's driveway prevented them from delivering. Called to see if patient is able to pick-up or if same day is needed.

## 2023-09-30 ENCOUNTER — Other Ambulatory Visit (HOSPITAL_COMMUNITY): Payer: Self-pay

## 2023-10-04 ENCOUNTER — Other Ambulatory Visit (HOSPITAL_COMMUNITY): Payer: Self-pay

## 2023-10-13 ENCOUNTER — Other Ambulatory Visit (HOSPITAL_COMMUNITY): Payer: Self-pay

## 2023-10-17 ENCOUNTER — Other Ambulatory Visit: Payer: Self-pay

## 2023-10-17 ENCOUNTER — Other Ambulatory Visit (HOSPITAL_COMMUNITY): Payer: Self-pay

## 2023-10-17 ENCOUNTER — Encounter (HOSPITAL_COMMUNITY): Payer: Self-pay

## 2023-10-17 NOTE — Progress Notes (Signed)
Specialty Pharmacy Refill Coordination Note  Michelle Ortega is a 52 y.o. female, patient contacted the Specialty Pharmacy via questionnaire today regarding refills of specialty medication(s) Adalimumab (Humira (2 Pen))   Patient requested (Patient-Rptd) Delivery   Delivery date: 10/18/23. Patient was left a voice mail due to New years closure Medication cannot be shipped on requested delivery date 10/20/23   Verified address: (Patient-Rptd) [address] [place] [address]   Medication will be filled on 10/17/23.

## 2023-10-17 NOTE — Progress Notes (Signed)
10/17/23: Humira  UPS Next Day Air: [phone]

## 2023-11-01 ENCOUNTER — Other Ambulatory Visit (HOSPITAL_COMMUNITY): Payer: Self-pay

## 2023-11-04 ENCOUNTER — Other Ambulatory Visit: Payer: Self-pay

## 2023-11-05 ENCOUNTER — Other Ambulatory Visit (HOSPITAL_COMMUNITY): Payer: Self-pay

## 2023-11-07 ENCOUNTER — Other Ambulatory Visit: Payer: Self-pay

## 2023-11-08 ENCOUNTER — Encounter (HOSPITAL_COMMUNITY): Payer: Self-pay

## 2023-11-08 ENCOUNTER — Other Ambulatory Visit (HOSPITAL_COMMUNITY): Payer: Self-pay

## 2023-11-11 ENCOUNTER — Other Ambulatory Visit: Payer: Self-pay

## 2023-11-11 NOTE — Progress Notes (Deleted)
 Office Visit Note  Patient: Michelle Ortega             Date of Birth: 1970/11/25           MRN: [id number]             PCP: Lavell Bari LABOR, FNP Referring: Lavell Bari LABOR, FNP Visit Date: 11/25/2023   Subjective:  No chief complaint on file.   History of Present Illness: Michelle Ortega is a 53 y.o. female here for follow up for seropositive RA on Humira  40 mg subcu q. 14 days and methotrexate  15 mg p.o. weekly and folic acid  1 mg daily.    Previous HPI 08/22/2023 Michelle Ortega is a 53 y.o. female here for follow up for seropositive RA on Humira  40 mg subcu q. 14 days and methotrexate  15 mg p.o. weekly and folic acid  1 mg daily.  Overall her joints are doing pretty well except for ongoing shoulder pain.  Still has pain with use and decreased mobility of her left shoulder.  She went for CT scan in September that showed severe glenohumeral joint osteoarthritis also incidentally identified 9 mm left upper lobe calcified nodule.  She has not experienced any new chest pain shortness of breath or new cough.  She has a follow-up appointment with her PCP later this week.  She is currently waiting they recommended up to 8 weeks for developing her shoulder model before scheduling for surgery.   Previous HPI 05/19/23 ARBOR Ortega is a 53 y.o. female here for follow up for sero positive RA on MTX 15 mg PO weekly and folic acid  1 mg daily and Humira  40 mg [place] q14 days. Symptoms have been doing pretty well with no flare and minimal joint swelling. No infections or problems taking her medication. Left shoulder bothers her a lot cannot raise overhead and gets pain lying on her side. Also pain on right hand 4th-5th fingers mostly.   Previous HPI 02/16/2023 Michelle Ortega is a 53 y.o. female here for follow up for sero positive RA on MTX 15 mg PO weekly and folic acid  1 mg daily and Humira  40 mg [place] q14days started after last visit. She has not noticed any problems with the injections besides not keeping on  pressure and only partially injecting the first dose. Hand pain and swelling is improved much more than on the methotrexate . Left shoulder pain and stiffness did not improved after intraarticular steroid injection.    Previous HPI 12/17/22 Michelle Ortega is a 53 y.o. female here for follow up for seropositive RA on methotrexate  that was increased to 20 mg p.o. weekly after last visit and folic acid  1 mg daily.  She has not seen much more improvement with persistent right wrist pain and swelling and left shoulder pain are most problematic areas.  Joint elsewhere are doing better.  Since increasing the methotrexate  dose she feels more sick she has had to take it on the weekend and is overall dissatisfied with the benefit on higher dose.  She has not had any serious infections since her last visit.   Previous HPI 09/17/22 Michelle Ortega is a 53 y.o. female here for follow up for seropositive RA after starting methotrexate  15 mg PO weekly and folic acid  1 mg daily.  Since starting methotrexate  she has seen a decrease in joint swelling there is still a little bit ongoing at the right wrist.  However still has joint pain and stiffness in multiple areas  despite the improvement in swelling.  Left shoulder pain is bothering her with some limited range of motion due to pain going more towards the neck and upper back and shoulder.  She had 1 episode of nausea lasting for about 2 days after her third dose of methotrexate  but has taken the medicine 5 times total with no problem on the other 4 occasions.   Previous HPI 07/26/22 Michelle Ortega is a 53 y.o. female here for evaluation of positive rheumatoid factor associated with multiple joint pains. Xrays of cervical spine and shoulders in May showing mild degenerative changes.  Neck and shoulder pain has been going on for least about 2 years in duration.  She first noticed significant increase in pains while in a long car ride to [place]  started having more pain and  stiffness that did not fully resolve despite multiple days passing.  As time went on also got increased symptoms affecting her hands and wrists with pain and swelling that started to severely limit activity.  Initially had concern about tickborne illness or infectious cause but did not have any rashes no history of suspicious bite exposures.  Initially symptoms improved but had waxing and waning episodes or flareups over time going on for most of the time.  She has had increased problems in the past 1 year mostly with her hands and wrists becoming the greater problem.  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  She has had previous treatments with anti-inflammatory medications but has never been on drug specifically for rheumatoid arthritis.  Most recently had a good improvement with diclofenac  but does not completely resolve the pain and swelling.  She had previous shoulder injection with steroids reported that these were extremely painful experiences was more helpful on the right side than the left.  She has a family history of rheumatoid arthritis reports multiple aunts and grandmother. She is a long-term current cigarette smoker.   Labs reviewed 11/2021 RF 483 ESR 115 Uric acid 3.7   No Rheumatology ROS completed.   PMFS History:  Patient Active Problem List   Diagnosis Date Noted   Lung nodule 08/22/2023   Pain in left shoulder 02/16/2023   Seropositive rheumatoid arthritis (HCC) 07/26/2022   High risk medication use 07/26/2022   Radiculopathy, cervical region 07/26/2022   Bilateral hand pain 07/26/2022   Chest wall pain 07/26/2022   Tobacco abuse 09/14/2021   PAF (paroxysmal atrial fibrillation) (HCC) 09/14/2021   Tachycardia 07/21/2021   GAD (generalized anxiety disorder) 02/07/2015   Vitamin D  deficiency 01/22/2015   Depression 01/20/2015   Headache 06/06/2013   Hypothyroidism 06/06/2013    Past Medical History:  Diagnosis  Date   A-fib (HCC)    Anxiety    Depression    Mild degeneration of cervical intervertebral disc    Pott's disease    Thyroid  disease     Family History  Problem Relation Age of Onset   Hypothyroidism Mother    Hypertension Mother    Diabetes Father    Heart disease Father    Kidney disease Father    Diabetes Sister 43       MI   Heart attack Sister    Diabetes Sister    Diabetes Brother    Depression Daughter    Healthy Son    Past Surgical History:  Procedure Laterality Date   BREAST LUMPECTOMY     KNEE SURGERY Left    TONSILLECTOMY  Social History   Social History Narrative   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    Immunization History  Administered Date(s) Administered   Influenza,inj,Quad PF,6+ Mos 07/27/2014   Td 02/07/2015   Tdap 02/07/2015, 06/20/2020     Objective: Vital Signs: There were no vitals taken for this visit.   Physical Exam   Musculoskeletal Exam: ***  CDAI Exam: CDAI Score: -- Patient Global: --; Provider Global: -- Swollen: --; Tender: -- Joint Exam 11/25/2023   No joint exam has been documented for this visit   There is currently no information documented on the homunculus. Go to the Rheumatology activity and complete the homunculus joint exam.  Investigation: No additional findings.  Imaging: No results found.  Recent Labs: Lab Results  Component Value Date   WBC 8.5 08/22/2023   HGB 14.1 08/22/2023   PLT 293 08/22/2023   NA 139 08/22/2023   K 3.8 08/22/2023   CL 102 08/22/2023   CO2 23 08/22/2023   GLUCOSE 91 08/22/2023   BUN 8 08/22/2023   CREATININE 0.64 08/22/2023   BILITOT 0.4 08/22/2023   ALKPHOS 130 (H) 12/01/2021   AST 14 08/22/2023   ALT 12 08/22/2023   PROT 8.0 08/22/2023   ALBUMIN 3.8 12/01/2021   CALCIUM 10.2 08/22/2023   GFRAA 131 01/20/2015   QFTBGOLDPLUS NEGATIVE 08/22/2023    Speciality Comments: No specialty comments available.  Procedures:  No procedures  performed Allergies: Erythromycin and Penicillins   Assessment / Plan:     Visit Diagnoses: No diagnosis found.  ***  Orders: No orders of the defined types were placed in this encounter.  No orders of the defined types were placed in this encounter.    Follow-Up Instructions: No follow-ups on file.   Shelba SHAUNNA Potters, RT  Note - This record has been created using [employer].  Chart creation errors have been sought, but may not always  have been located. Such creation errors do not reflect on  the standard of medical care.

## 2023-11-15 ENCOUNTER — Other Ambulatory Visit: Payer: Self-pay

## 2023-11-15 NOTE — Progress Notes (Signed)
Specialty Pharmacy Refill Coordination Note  Michelle Ortega is a 53 y.o. female contacted today regarding refills of specialty medication(s) Adalimumab (Humira (2 Pen))   Patient requested Delivery   Delivery date: 11/17/23   Verified address: [address]   [place] [address]   Medication will be filled on 11/16/23. UPS

## 2023-11-16 ENCOUNTER — Other Ambulatory Visit: Payer: Self-pay

## 2023-11-25 ENCOUNTER — Telehealth: Payer: Self-pay | Admitting: Pharmacist

## 2023-11-25 ENCOUNTER — Ambulatory Visit: Payer: Medicaid Other | Admitting: Internal Medicine

## 2023-11-25 DIAGNOSIS — M059 Rheumatoid arthritis with rheumatoid factor, unspecified: Secondary | ICD-10-CM

## 2023-11-25 DIAGNOSIS — R911 Solitary pulmonary nodule: Secondary | ICD-10-CM

## 2023-11-25 DIAGNOSIS — G8929 Other chronic pain: Secondary | ICD-10-CM

## 2023-11-25 DIAGNOSIS — Z720 Tobacco use: Secondary | ICD-10-CM

## 2023-11-25 DIAGNOSIS — Z79899 Other long term (current) drug therapy: Secondary | ICD-10-CM

## 2023-11-25 NOTE — Telephone Encounter (Signed)
 Received notification from [facility] regarding a prior authorization for HUMIRA . Authorization has been APPROVED from 11/25/2023 to 11/24/2024. Approval letter sent to scan center.  Per test claim, copay for 28 days supply is $4  Patient can continue to fill through [facility]: [phone]   Authorization # [id number]  Sherry Pennant, PharmD, MPH, BCPS, CPP Clinical Pharmacist (Rheumatology and Pulmonology)

## 2023-11-25 NOTE — Telephone Encounter (Signed)
 Submitted a Prior Authorization RENEWAL request to [facility] for HUMIRA  via CoverMyMeds. Will update once we receive a response.  Key: [id number]

## 2023-12-05 ENCOUNTER — Other Ambulatory Visit (HOSPITAL_COMMUNITY): Payer: Self-pay

## 2023-12-08 ENCOUNTER — Other Ambulatory Visit: Payer: Self-pay | Admitting: Internal Medicine

## 2023-12-08 ENCOUNTER — Other Ambulatory Visit: Payer: Self-pay

## 2023-12-08 ENCOUNTER — Other Ambulatory Visit (HOSPITAL_COMMUNITY): Payer: Self-pay

## 2023-12-08 DIAGNOSIS — Z79899 Other long term (current) drug therapy: Secondary | ICD-10-CM

## 2023-12-08 DIAGNOSIS — M059 Rheumatoid arthritis with rheumatoid factor, unspecified: Secondary | ICD-10-CM

## 2023-12-08 NOTE — Telephone Encounter (Signed)
Last Fill: 09/22/2023  Labs: 08/22/2023 Patient advised Sedimentation rate of 34 is mildly elevated.  Blood count kidney and liver function are all normal.  There is no problem for continuing her current medications   TB Gold: 08/22/2023 Neg    Next Visit: 01/20/2024  Last Visit: 08/22/2023  [id number] rheumatoid arthritis   Current Dose per office note 08/22/2023: Humira 40 mg subcu q. 14 days.   Patient advised she is due to update labs. Patient plans to come tomorrow to update.   Okay to refill Humira?

## 2023-12-08 NOTE — Progress Notes (Signed)
Specialty Pharmacy Ongoing Clinical Assessment Note  Michelle Ortega is a 53 y.o. female who is being followed by the specialty pharmacy service for RxSp Rheumatoid Arthritis   Patient's specialty medication(s) reviewed today: Adalimumab (Humira (2 Pen))   Missed doses in the last 4 weeks: 0   Patient/Caregiver did not have any additional questions or concerns.   Therapeutic benefit summary: Patient is achieving benefit   Adverse events/side effects summary: No adverse events/side effects   Patient's therapy is appropriate to: Continue    Goals Addressed             This Visit's Progress    Minimize recurrence of flares       Patient is on track. Patient will maintain adherence.  Patient reports that she is reasonably well-controlled on therapy with some baseline pain.          Follow up:  6 months  Servando Snare Specialty Pharmacist

## 2023-12-08 NOTE — Progress Notes (Signed)
Specialty Pharmacy Refill Coordination Note  Michelle Ortega is a 53 y.o. female contacted today regarding refills of specialty medication(s) Adalimumab (Humira (2 Pen))   Patient requested Delivery   Delivery date: 12/13/23   Verified address: [address]  [place] [address]   Medication will be filled on 12/12/23. This fill date is pending response to refill request from provider. Patient is aware and if they have not received fill by intended date they must follow up with pharmacy.

## 2023-12-09 ENCOUNTER — Other Ambulatory Visit: Payer: Self-pay

## 2023-12-09 ENCOUNTER — Other Ambulatory Visit (HOSPITAL_COMMUNITY): Payer: Self-pay

## 2023-12-09 MED ORDER — HUMIRA (2 PEN) 40 MG/0.4ML ~~LOC~~ AJKT
40.0000 mg | AUTO-INJECTOR | SUBCUTANEOUS | 1 refills | Status: DC
  Filled 2023-12-09: qty 2, 28d supply, fill #0
  Filled 2024-01-03: qty 2, 28d supply, fill #1

## 2023-12-12 ENCOUNTER — Other Ambulatory Visit: Payer: Self-pay

## 2023-12-20 ENCOUNTER — Ambulatory Visit: Payer: Medicaid Other | Admitting: Dermatology

## 2023-12-20 DIAGNOSIS — D229 Melanocytic nevi, unspecified: Secondary | ICD-10-CM

## 2023-12-20 DIAGNOSIS — L710 Perioral dermatitis: Secondary | ICD-10-CM

## 2023-12-20 DIAGNOSIS — D2272 Melanocytic nevi of left lower limb, including hip: Secondary | ICD-10-CM | POA: Diagnosis not present

## 2023-12-20 DIAGNOSIS — Z1283 Encounter for screening for malignant neoplasm of skin: Secondary | ICD-10-CM

## 2023-12-20 DIAGNOSIS — L578 Other skin changes due to chronic exposure to nonionizing radiation: Secondary | ICD-10-CM | POA: Diagnosis not present

## 2023-12-20 DIAGNOSIS — D225 Melanocytic nevi of trunk: Secondary | ICD-10-CM | POA: Diagnosis not present

## 2023-12-20 DIAGNOSIS — D224 Melanocytic nevi of scalp and neck: Secondary | ICD-10-CM | POA: Diagnosis not present

## 2023-12-20 DIAGNOSIS — D1801 Hemangioma of skin and subcutaneous tissue: Secondary | ICD-10-CM

## 2023-12-20 DIAGNOSIS — M059 Rheumatoid arthritis with rheumatoid factor, unspecified: Secondary | ICD-10-CM | POA: Diagnosis not present

## 2023-12-20 DIAGNOSIS — H0264 Xanthelasma of left upper eyelid: Secondary | ICD-10-CM | POA: Diagnosis not present

## 2023-12-20 DIAGNOSIS — L821 Other seborrheic keratosis: Secondary | ICD-10-CM

## 2023-12-20 DIAGNOSIS — L814 Other melanin hyperpigmentation: Secondary | ICD-10-CM

## 2023-12-20 DIAGNOSIS — W098XXA Fall on or from other playground equipment, initial encounter: Secondary | ICD-10-CM | POA: Diagnosis not present

## 2023-12-20 MED ORDER — CLINDAMYCIN PHOSPHATE 1 % EX LOTN: TOPICAL_LOTION | CUTANEOUS | 1 refills | Status: DC

## 2023-12-20 NOTE — Patient Instructions (Addendum)

## 2023-12-20 NOTE — Progress Notes (Signed)
 New Patient Visit   Subjective  Michelle Ortega is a 53 y.o. female who presents for the following: Skin Cancer Screening and Full Body Skin Exam, patient was referred here for a mole check by her rheumatologist she has been taking Humira for RA for ~ 1 year now.   The patient presents for Total-Body Skin Exam (TBSE) for skin cancer screening and mole check. The patient has spots, moles and lesions to be evaluated, some may be new or changing, including perioral and upper eyelid.   The following portions of the chart were reviewed this encounter and updated as appropriate: medications, allergies, medical history  Review of Systems:  No other skin or systemic complaints except as noted in HPI or Assessment and Plan.  Objective  Well appearing patient in no apparent distress; mood and affect are within normal limits.  A full examination was performed including scalp, head, eyes, ears, nose, lips, neck, chest, axillae, abdomen, back, buttocks, bilateral upper extremities, bilateral lower extremities, hands, feet, fingers, toes, fingernails, and toenails. All findings within normal limits unless otherwise noted below.   Relevant physical exam findings are noted in the Assessment and Plan.    Assessment & Plan   SKIN CANCER SCREENING PERFORMED TODAY.  ACTINIC DAMAGE - Chronic condition, secondary to cumulative UV/sun exposure - diffuse scaly erythematous macules with underlying dyspigmentation - Recommend daily broad spectrum sunscreen SPF 30+ to sun-exposed areas, reapply every 2 hours as needed.  - Staying in the shade or wearing long sleeves, sun glasses (UVA+UVB protection) and wide brim hats (4-inch brim around the entire circumference of the hat) are also recommended for sun protection.  - Call for new or changing lesions.  LENTIGINES, SEBORRHEIC KERATOSES, HEMANGIOMAS - Benign normal skin lesions - Benign-appearing - Call for any changes  MELANOCYTIC NEVI - Tan-brown and/or  pink-flesh-colored symmetric macules and papules, including upper back, chest - 3 mm brown macule Left lower abdomen - 3 x 2 mm brown macule Left medial lower leg - pink flesh papule Right temporal scalp - Benign appearing on exam today. Discussed shave removal if becomes bothersome.  - Observation - Call clinic for new or changing moles - Recommend daily use of broad spectrum spf 30+ sunscreen to sun-exposed areas.    RHEUMATOID ARTHRITIS  Currently taking Humira- prescribed by her rheumatologist  Continue annual skin exams due to immunosuppression and increased risk for skin cancer  XANTHELASMA Exam: Yellow soft papule medial L upper eyelid  Treatment:  Benign, observe.  Discussed shave removal with ED if bothersome. Patient has yearly labs/lipids checked by PCP and have been normal.  PERIORAL DERMATITIS  Exam: Small pink papules at right upper lip/peri nasal alar  Perioral dermatitis is an eruption which is usually located around the mouth and nose.  It can be a rash and/or red bumps.  It occasionally occurs around the eyes.  It may be itchy and may burn.  The exact cause is unknown.  Some types of makeup, moisturizers, dental products, and prescription creams may be partially responsible for the eruption.  Topical steroids such as cortisone creams can temporarily make the rash better but with discontinuation the rash tends to recur and worsen, so they should be avoided. Topical antibiotics, elidel cream, protopic ointment, and oral antibiotics may be prescribed to treat this condition.  Although perioral dermatitis is not an infection, some antibiotics have anti-inflammatory properties that help it greatly.   Treatment Plan:  Start Clindamycin lotion Apply to AA twice daily until improved.  If not improved in 1 month, patient will call for oral antibiotic (Doxy).   Return in about 1 year (around 12/19/2024) for TBSE.  ICherlyn Labella, CMA, am acting as scribe for Willeen Niece, MD  .   Documentation: I have reviewed the above documentation for accuracy and completeness, and I agree with the above.  Willeen Niece, MD

## 2023-12-26 ENCOUNTER — Other Ambulatory Visit: Payer: Self-pay

## 2024-01-03 ENCOUNTER — Other Ambulatory Visit: Payer: Self-pay

## 2024-01-03 NOTE — Progress Notes (Signed)
 Specialty Pharmacy Refill Coordination Note  Michelle Ortega is a 53 y.o. female contacted today regarding refills of specialty medication(s) Adalimumab (Humira (2 Pen))   Patient requested (Patient-Rptd) Delivery   Delivery date: (Patient-Rptd) 01/10/24   Verified address: (Patient-Rptd) [address] [place] [address]   Medication will be filled on 03.24.25.

## 2024-01-06 NOTE — Progress Notes (Deleted)
 Office Visit Note  Patient: Michelle Ortega             Date of Birth: [birth year]           MRN: [id number]             PCP: Junie Spencer, FNP Referring: Junie Spencer, FNP Visit Date: 01/20/2024   Subjective:  No chief complaint on file.   History of Present Illness: EMMY Ortega is a 53 y.o. female here for follow up for seropositive RA on Humira 40 mg subcu q. 14 days and methotrexate 15 mg p.o. weekly and folic acid 1 mg daily.    Previous HPI 08/22/2023 Michelle Ortega is a 53 y.o. female here for follow up for seropositive RA on Humira 40 mg subcu q. 14 days and methotrexate 15 mg p.o. weekly and folic acid 1 mg daily.  Overall her joints are doing pretty well except for ongoing shoulder pain.  Still has pain with use and decreased mobility of her left shoulder.  She went for CT scan in September that showed severe glenohumeral joint osteoarthritis also incidentally identified 9 mm left upper lobe calcified nodule.  She has not experienced any new chest pain shortness of breath or new cough.  She has a follow-up appointment with her PCP later this week.  She is currently waiting they recommended up to 8 weeks for developing her shoulder model before scheduling for surgery.   Previous HPI 05/19/23 [name] is a 53 y.o. female here for follow up for sero positive RA on MTX 15 mg PO weekly and folic acid 1 mg daily and Humira 40 mg [place] q14 days. Symptoms have been doing pretty well with no flare and minimal joint swelling. No infections or problems taking her medication. Left shoulder bothers her a lot cannot raise overhead and gets pain lying on her side. Also pain on right hand 4th-5th fingers mostly.   Previous HPI 02/16/2023 Michelle Ortega is a 53 y.o. female here for follow up for sero positive RA on MTX 15 mg PO weekly and folic acid 1 mg daily and Humira 40 mg [place] q14days started after last visit. She has not noticed any problems with the injections besides not keeping on  pressure and only partially injecting the first dose. Hand pain and swelling is improved much more than on the methotrexate. Left shoulder pain and stiffness did not improved after intraarticular steroid injection.    Previous HPI 12/17/22 Michelle Ortega is a 53 y.o. female here for follow up for seropositive RA on methotrexate that was increased to 20 mg p.o. weekly after last visit and folic acid 1 mg daily.  She has not seen much more improvement with persistent right wrist pain and swelling and left shoulder pain are most problematic areas.  Joint elsewhere are doing better.  Since increasing the methotrexate dose she feels more sick she has had to take it on the weekend and is overall dissatisfied with the benefit on higher dose.  She has not had any serious infections since her last visit.   Previous HPI 09/17/22 Michelle Ortega is a 53 y.o. female here for follow up for seropositive RA after starting methotrexate 15 mg PO weekly and folic acid 1 mg daily.  Since starting methotrexate she has seen a decrease in joint swelling there is still a little bit ongoing at the right wrist.  However still has joint pain and stiffness in multiple areas  despite the improvement in swelling.  Left shoulder pain is bothering her with some limited range of motion due to pain going more towards the neck and upper back and shoulder.  She had 1 episode of nausea lasting for about 2 days after her third dose of methotrexate but has taken the medicine 5 times total with no problem on the other 4 occasions.   Previous HPI 07/26/22 Michelle Ortega is a 53 y.o. female here for evaluation of positive rheumatoid factor associated with multiple joint pains. Xrays of cervical spine and shoulders in May showing mild degenerative changes.  Neck and shoulder pain has been going on for least about 2 years in duration.  She first noticed significant increase in pains while in a long car ride to [place] started having more pain and  stiffness that did not fully resolve despite multiple days passing.  As time went on also got increased symptoms affecting her hands and wrists with pain and swelling that started to severely limit activity.  Initially had concern about tickborne illness or infectious cause but did not have any rashes no history of suspicious bite exposures.  Initially symptoms improved but had waxing and waning episodes or flareups over time going on for most of the time.  She has had increased problems in the past 1 year mostly with her hands and wrists becoming the greater problem.  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  She has had previous treatments with anti-inflammatory medications but has never been on drug specifically for rheumatoid arthritis.  Most recently had a good improvement with diclofenac but does not completely resolve the pain and swelling.  She had previous shoulder injection with steroids reported that these were extremely painful experiences was more helpful on the right side than the left.  She has a family history of rheumatoid arthritis reports multiple aunts and grandmother. She is a long-term current cigarette smoker.   Labs reviewed 11/2021 RF 483 ESR 115 Uric acid 3.7   No Rheumatology ROS completed.   PMFS History:  Patient Active Problem List   Diagnosis Date Noted   Lung nodule 08/22/2023   Pain in left shoulder 02/16/2023   Seropositive rheumatoid arthritis (HCC) 07/26/2022   High risk medication use 07/26/2022   Radiculopathy, cervical region 07/26/2022   Bilateral hand pain 07/26/2022   Chest wall pain 07/26/2022   Tobacco abuse 09/14/2021   PAF (paroxysmal atrial fibrillation) (HCC) 09/14/2021   Tachycardia 07/21/2021   GAD (generalized anxiety disorder) 02/07/2015   Vitamin D deficiency 01/22/2015   Depression 01/20/2015   Headache 06/06/2013   Hypothyroidism 06/06/2013    Past Medical History:  Diagnosis  Date   A-fib (HCC)    Anxiety    Depression    Mild degeneration of cervical intervertebral disc    Pott's disease    Thyroid disease     Family History  Problem Relation Age of Onset   Hypothyroidism Mother    Hypertension Mother    Diabetes Father    Heart disease Father    Kidney disease Father    Diabetes Sister 37       MI   Heart attack Sister    Diabetes Sister    Diabetes Brother    Depression Daughter    Healthy Son    Past Surgical History:  Procedure Laterality Date   BREAST LUMPECTOMY     KNEE SURGERY Left    TONSILLECTOMY  Social History   Social History Narrative   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    Immunization History  Administered Date(s) Administered   Influenza,inj,Quad PF,6+ Mos 07/27/2014   Td 02/07/2015   Tdap 02/07/2015, 06/20/2020     Objective: Vital Signs: There were no vitals taken for this visit.   Physical Exam   Musculoskeletal Exam: ***  CDAI Exam: CDAI Score: -- Patient Global: --; Provider Global: -- Swollen: --; Tender: -- Joint Exam 01/20/2024   No joint exam has been documented for this visit   There is currently no information documented on the homunculus. Go to the Rheumatology activity and complete the homunculus joint exam.  Investigation: No additional findings.  Imaging: No results found.  Recent Labs: Lab Results  Component Value Date   WBC 8.5 08/22/2023   HGB 14.1 08/22/2023   PLT 293 08/22/2023   NA 139 08/22/2023   K 3.8 08/22/2023   CL 102 08/22/2023   CO2 23 08/22/2023   GLUCOSE 91 08/22/2023   BUN 8 08/22/2023   CREATININE 0.64 08/22/2023   BILITOT 0.4 08/22/2023   ALKPHOS 130 (H) 12/01/2021   AST 14 08/22/2023   ALT 12 08/22/2023   PROT 8.0 08/22/2023   ALBUMIN 3.8 12/01/2021   CALCIUM 10.2 08/22/2023   GFRAA 131 01/20/2015   QFTBGOLDPLUS NEGATIVE 08/22/2023    Speciality Comments: No specialty comments available.  Procedures:  No procedures  performed Allergies: Erythromycin and Penicillins   Assessment / Plan:     Visit Diagnoses: No diagnosis found.  ***  Orders: No orders of the defined types were placed in this encounter.  No orders of the defined types were placed in this encounter.    Follow-Up Instructions: No follow-ups on file.   Metta Clines, RT  Note - This record has been created using [employer].  Chart creation errors have been sought, but may not always  have been located. Such creation errors do not reflect on  the standard of medical care.

## 2024-01-09 ENCOUNTER — Other Ambulatory Visit: Payer: Self-pay

## 2024-01-18 ENCOUNTER — Telehealth: Payer: Self-pay

## 2024-01-18 ENCOUNTER — Other Ambulatory Visit (HOSPITAL_COMMUNITY): Payer: Self-pay

## 2024-01-18 NOTE — Telephone Encounter (Signed)
 Pharmacy Patient Advocate Encounter  Received notification from [facility] that Prior Authorization for Diclofenac Sodium 75MG  dr tablets has been APPROVED from 11/2723 to 12/14/24. Unable to obtain price due to refill too soon rejection, last fill date 12/15/23 next available fill date05/06/25   PA #/Case ID/Reference #: --

## 2024-01-20 ENCOUNTER — Ambulatory Visit: Payer: Medicaid Other | Admitting: Internal Medicine

## 2024-01-20 DIAGNOSIS — R911 Solitary pulmonary nodule: Secondary | ICD-10-CM

## 2024-01-20 DIAGNOSIS — Z720 Tobacco use: Secondary | ICD-10-CM

## 2024-01-20 DIAGNOSIS — Z79899 Other long term (current) drug therapy: Secondary | ICD-10-CM

## 2024-01-20 DIAGNOSIS — G8929 Other chronic pain: Secondary | ICD-10-CM

## 2024-01-20 DIAGNOSIS — M059 Rheumatoid arthritis with rheumatoid factor, unspecified: Secondary | ICD-10-CM

## 2024-02-01 ENCOUNTER — Other Ambulatory Visit (HOSPITAL_COMMUNITY): Payer: Self-pay

## 2024-02-01 ENCOUNTER — Other Ambulatory Visit: Payer: Self-pay

## 2024-02-01 ENCOUNTER — Other Ambulatory Visit: Payer: Self-pay | Admitting: Internal Medicine

## 2024-02-01 DIAGNOSIS — Z79899 Other long term (current) drug therapy: Secondary | ICD-10-CM

## 2024-02-01 DIAGNOSIS — M059 Rheumatoid arthritis with rheumatoid factor, unspecified: Secondary | ICD-10-CM

## 2024-02-01 NOTE — Progress Notes (Signed)
 Specialty Pharmacy Refill Coordination Note  NA WALDRIP is a 53 y.o. female contacted today regarding refills of specialty medication(s) Humira.  Patient requested (Patient-Rptd) Delivery   Delivery date: (Patient-Rptd) 02/06/24   Verified address: (Patient-Rptd) [address] [place] [address]   Medication will be filled on 02/06/24. New delivery date is 02/07/24. Patient has been notified.

## 2024-02-01 NOTE — Telephone Encounter (Signed)
 Last Fill: 12/09/2023  Labs: 08/22/2023 Sedimentation rate of 34 is mildly elevated.  Blood count kidney and liver function are all normal.  There is no problem for continuing her current medications but I would not recommend trying any reduction of the methotrexate dose at this time.   TB Gold: 08/21/2024 Negative   Next Visit: 03/23/2024  Last Visit: 08/22/2023  IO:[id number] rheumatoid arthritis (HCC)   Current Dose per office note 08/22/2023: Humira 40 mg subcu q. 14 days   Attempted to contact the patient and left a message to call the office back regarding overdue labs.   Okay to refill Humira?

## 2024-02-02 ENCOUNTER — Other Ambulatory Visit: Payer: Self-pay

## 2024-02-02 MED ORDER — HUMIRA (2 PEN) 40 MG/0.4ML ~~LOC~~ AJKT
40.0000 mg | AUTO-INJECTOR | SUBCUTANEOUS | 1 refills | Status: DC
  Filled 2024-02-02: qty 2, 28d supply, fill #0
  Filled 2024-03-02: qty 2, 28d supply, fill #1

## 2024-02-03 ENCOUNTER — Other Ambulatory Visit: Payer: Self-pay

## 2024-02-06 ENCOUNTER — Other Ambulatory Visit: Payer: Self-pay

## 2024-02-07 ENCOUNTER — Telehealth: Payer: Self-pay | Admitting: Cardiology

## 2024-02-07 NOTE — Telephone Encounter (Signed)
 Deleting recall- unable to contact pt to sch after multiple attempts

## 2024-02-28 ENCOUNTER — Other Ambulatory Visit: Payer: Self-pay

## 2024-03-02 ENCOUNTER — Other Ambulatory Visit: Payer: Self-pay

## 2024-03-02 NOTE — Progress Notes (Signed)
 Patient called back and states she had her dates incorrect and actually needs to inject on 5/23. Changed ship date to 5/19 instead.

## 2024-03-02 NOTE — Progress Notes (Signed)
 Specialty Pharmacy Refill Coordination Note  Michelle Ortega is a 54 y.o. female contacted today regarding refills of specialty medication(s) Adalimumab  (Humira  (2 Pen))   Patient requested Delivery   Delivery date: 03/09/24   Verified address: [address] [place] [address]   Medication will be filled on 05.22.25.

## 2024-03-09 NOTE — Progress Notes (Signed)
 Office Visit Note  Patient: Michelle Ortega             Date of Birth: Jan 03, 1971           MRN: [id number]             PCP: Yevette Hem, FNP Referring: Yevette Hem, FNP Visit Date: 03/23/2024   Subjective:  Follow-up    Discussed the use of AI scribe software for clinical note transcription with the patient, who gave verbal consent to proceed.  History of Present Illness   Michelle Ortega is a 53 y.o. female here for follow up for seropositive RA on Humira  40 mg subcu q. 14 days and methotrexate  15 mg p.o. weekly and folic acid  1 mg daily.    Her rheumatoid arthritis is mostly well-controlled, but she experiences persistent shoulder pain, particularly in the right shoulder.Previously evaluated and recommended for surgery but has not followed up and scheduled anything due to travel and family commitments.  She describes swelling and soreness in her hands, particularly in the middle finger, which is 'real sore' and sometimes requires her to remove her rings. The swelling is more frequent in the right hand, which she uses more often.  She also reports hip pain, which she attributes to her sleeping position or mattress, noting stiffness in the mornings that lasts about 20-30 minutes and improves with movement. The stiffness is more noticeable in the evenings.  No recent illnesses, viral infections, or the need for antibiotics, except for using an antibiotic cream for a facial issue addressed by a dermatologist.  Previous HPI 08/22/2023 Michelle Ortega is a 53 y.o. female here for follow up for seropositive RA on Humira  40 mg subcu q. 14 days and methotrexate  15 mg p.o. weekly and folic acid  1 mg daily.  Overall her joints are doing pretty well except for ongoing shoulder pain.  Still has pain with use and decreased mobility of her left shoulder.  She went for CT scan in September that showed severe glenohumeral joint osteoarthritis also incidentally identified 9 mm left upper lobe  calcified nodule.  She has not experienced any new chest pain shortness of breath or new cough.  She has a follow-up appointment with her PCP later this week.  She is currently waiting they recommended up to 8 weeks for developing her shoulder model before scheduling for surgery.   Previous HPI 05/19/23 Michelle Ortega is a 53 y.o. female here for follow up for sero positive RA on MTX 15 mg PO weekly and folic acid  1 mg daily and Humira  40 mg [place] q14 days. Symptoms have been doing pretty well with no flare and minimal joint swelling. No infections or problems taking her medication. Left shoulder bothers her a lot cannot raise overhead and gets pain lying on her side. Also pain on right hand 4th-5th fingers mostly.   Previous HPI 02/16/2023 Michelle Ortega is a 53 y.o. female here for follow up for sero positive RA on MTX 15 mg PO weekly and folic acid  1 mg daily and Humira  40 mg [place] q14days started after last visit. She has not noticed any problems with the injections besides not keeping on pressure and only partially injecting the first dose. Hand pain and swelling is improved much more than on the methotrexate . Left shoulder pain and stiffness did not improved after intraarticular steroid injection.    Previous HPI 12/17/22 Michelle Ortega is a 53 y.o. female here for follow up  for seropositive RA on methotrexate  that was increased to 20 mg p.o. weekly after last visit and folic acid  1 mg daily.  She has not seen much more improvement with persistent right wrist pain and swelling and left shoulder pain are most problematic areas.  Joint elsewhere are doing better.  Since increasing the methotrexate  dose she feels more sick she has had to take it on the weekend and is overall dissatisfied with the benefit on higher dose.  She has not had any serious infections since her last visit.   Previous HPI 09/17/22 Michelle Ortega is a 53 y.o. female here for follow up for seropositive RA after starting methotrexate  15 mg  PO weekly and folic acid  1 mg daily.  Since starting methotrexate  she has seen a decrease in joint swelling there is still a little bit ongoing at the right wrist.  However still has joint pain and stiffness in multiple areas despite the improvement in swelling.  Left shoulder pain is bothering her with some limited range of motion due to pain going more towards the neck and upper back and shoulder.  She had 1 episode of nausea lasting for about 2 days after her third dose of methotrexate  but has taken the medicine 5 times total with no problem on the other 4 occasions.   Previous HPI 07/26/22 Michelle Ortega is a 53 y.o. female here for evaluation of positive rheumatoid factor associated with multiple joint pains. Xrays of cervical spine and shoulders in May showing mild degenerative changes.  Neck and shoulder pain has been going on for least about 2 years in duration.  She first noticed significant increase in pains while in a long car ride to [place]  started having more pain and stiffness that did not fully resolve despite multiple days passing.  As time went on also got increased symptoms affecting her hands and wrists with pain and swelling that started to severely limit activity.  Initially had concern about tickborne illness or infectious cause but did not have any rashes no history of suspicious bite exposures.  Initially symptoms improved but had waxing and waning episodes or flareups over time going on for most of the time.  She has had increased problems in the past 1 year mostly with her hands and wrists becoming the greater problem.  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  She has had previous treatments with anti-inflammatory medications but has never been on drug specifically for rheumatoid arthritis.  Most recently had a good improvement with diclofenac  but does not completely resolve the pain and swelling.  She had previous shoulder  injection with steroids reported that these were extremely painful experiences was more helpful on the right side than the left.  She has a family history of rheumatoid arthritis reports multiple aunts and grandmother. She is a long-term current cigarette smoker.   Labs reviewed 11/2021 RF 483 ESR 115 Uric acid 3.7   Review of Systems  Constitutional:  Positive for fatigue.  HENT:  Negative for mouth sores and mouth dryness.   Eyes:  Negative for dryness.  Respiratory:  Negative for shortness of breath.   Cardiovascular:  Positive for palpitations. Negative for chest pain.  Gastrointestinal:  Positive for constipation and diarrhea. Negative for blood in stool.  Endocrine: Negative for increased urination.  Genitourinary:  Negative for involuntary urination.  Musculoskeletal:  Positive for joint pain, joint pain, joint swelling, myalgias, muscle weakness, morning stiffness,  muscle tenderness and myalgias. Negative for gait problem.  Skin:  Positive for sensitivity to sunlight. Negative for color change, rash and hair loss.  Allergic/Immunologic: Negative for susceptible to infections.  Neurological:  Negative for dizziness and headaches.  Hematological:  Negative for swollen glands.  Psychiatric/Behavioral:  Positive for depressed mood and sleep disturbance. The patient is nervous/anxious.     PMFS History:  Patient Active Problem List   Diagnosis Date Noted   Lung nodule 08/22/2023   Pain in left shoulder 02/16/2023   Seropositive rheumatoid arthritis (HCC) 07/26/2022   High risk medication use 07/26/2022   Radiculopathy, cervical region 07/26/2022   Bilateral hand pain 07/26/2022   Chest wall pain 07/26/2022   Tobacco abuse 09/14/2021   PAF (paroxysmal atrial fibrillation) (HCC) 09/14/2021   Tachycardia 07/21/2021   GAD (generalized anxiety disorder) 02/07/2015   Vitamin D  deficiency 01/22/2015   Depression 01/20/2015   Headache 06/06/2013   Hypothyroidism 06/06/2013     Past Medical History:  Diagnosis Date   A-fib (HCC)    Anxiety    Depression    Mild degeneration of cervical intervertebral disc    Pott's disease    Thyroid  disease     Family History  Problem Relation Age of Onset   Hypothyroidism Mother    Hypertension Mother    Diabetes Father    Heart disease Father    Kidney disease Father    Diabetes Sister 62       MI   Heart attack Sister    Diabetes Sister    Diabetes Brother    Depression Daughter    Healthy Son    Past Surgical History:  Procedure Laterality Date   BREAST LUMPECTOMY     KNEE SURGERY Left    TONSILLECTOMY     Social History   Social History Narrative   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    Immunization History  Administered Date(s) Administered   Influenza,inj,Quad PF,6+ Mos 07/27/2014   Td 02/07/2015   Tdap 02/07/2015, 06/20/2020     Objective: Vital Signs: BP 117/80 (BP Location: Left Arm, Patient Position: Sitting, Cuff Size: Normal)   Pulse 96   Resp 14   Ht 5' 6 (1.676 m)   Wt 129 lb (58.5 kg)   BMI 20.82 kg/m    Physical Exam  Eyes:     Conjunctiva/sclera: Conjunctivae normal.    Cardiovascular:     Rate and Rhythm: Normal rate and regular rhythm.  Pulmonary:     Effort: Pulmonary effort is normal.     Breath sounds: Normal breath sounds.   Musculoskeletal:     Right lower leg: No edema.     Left lower leg: No edema.  Lymphadenopathy:     Cervical: No cervical adenopathy.   Skin:    General: Skin is warm and dry.   Neurological:     Mental Status: She is alert.   Psychiatric:        Mood and Affect: Mood normal.      Musculoskeletal Exam:  Left shoulder tenderness to pressure unable to abduct significantly above head limited by pain, external rotation also limited even while at side Elbows full ROM no tenderness or swelling Right wrist with prominent ulnar styloid and mild subluxation no palpable swelling or tenderness to pressure, full range of motion  intact Fingers full ROM, right third MCP joint tenderness with no palpable swelling or nodule Knees full ROM no tenderness or swelling Ankles full ROM no tenderness  or swelling       Investigation: No additional findings.  Imaging: No results found.  Recent Labs: Lab Results  Component Value Date   WBC 13.5 (H) 03/23/2024   HGB 12.7 03/23/2024   PLT 271 03/23/2024   NA 138 03/23/2024   K 3.8 03/23/2024   CL 105 03/23/2024   CO2 24 03/23/2024   GLUCOSE 99 03/23/2024   BUN 14 03/23/2024   CREATININE 0.57 03/23/2024   BILITOT 0.3 03/23/2024   ALKPHOS 130 (H) 12/01/2021   AST 16 03/23/2024   ALT 14 03/23/2024   PROT 7.2 03/23/2024   ALBUMIN 3.8 12/01/2021   CALCIUM 9.4 03/23/2024   GFRAA 131 01/20/2015   QFTBGOLDPLUS NEGATIVE 08/22/2023    Speciality Comments: No specialty comments available.  Procedures:  No procedures performed Allergies: Erythromycin and Penicillins   Assessment / Plan:     Visit Diagnoses: Seropositive rheumatoid arthritis (HCC) - Plan: Sedimentation rate, methotrexate  (RHEUMATREX) 2.5 MG tablet, adalimumab  (HUMIRA , 2 PEN,) 40 MG/0.4ML pen, folic acid  (FOLVITE ) 1 MG tablet Rheumatoid arthritis with intermittent swelling and soreness, managed with Humira  and methotrexate . No significant damage on previous hand x-rays. Current symptoms include hand swelling, middle finger soreness, and morning stiffness. - Recheck sed rate for disease activity monitoring - Consider repeating hand x-rays at next visit rule out radiographic progression given chronic joint changes especially the wrist - Continue Humira  40 mg subcu q. 14 days - Continue methotrexate  15 mg p.o. weekly folic acid  1 mg daily  High risk medication use - Plan: CBC with Differential/Platelet, Comprehensive metabolic panel with GFR, Lipid panel, adalimumab  (HUMIRA , 2 PEN,) 40 MG/0.4ML pen Tolerating Humira  and methotrexate  well with no serious interval infections.  No particular drug  intolerance reported. - Checking CBC and CMP for medication monitoring continue long-term use of Humira  and methotrexate   Shoulder pain Chronic right shoulder pain affecting daily activities. Impingement noted. - Recommend follow-up with orthopedic specialist.    Orders: Orders Placed This Encounter  Procedures   Sedimentation rate   CBC with Differential/Platelet   Comprehensive metabolic panel with GFR   Lipid panel   Meds ordered this encounter  Medications   methotrexate  (RHEUMATREX) 2.5 MG tablet    Sig: Take 6 tablets (15 mg total) by mouth once a week. Caution:Chemotherapy. Protect from light.    Dispense:  78 tablet    Refill:  0   adalimumab  (HUMIRA , 2 PEN,) 40 MG/0.4ML pen    Sig: Inject 40 mg into the skin every 14 (fourteen) days.    Dispense:  2 each    Refill:  2    Prescription Type::   Renewal   folic acid  (FOLVITE ) 1 MG tablet    Sig: Take 1 tablet (1 mg total) by mouth daily.    Dispense:  90 tablet    Refill:  3     Follow-Up Instructions: Return in about 3 months (around 06/23/2024) for RA on ADA/MTX f/u 3mos.   Matt Song, MD  Note - This record has been created using [employer].  Chart creation errors have been sought, but may not always  have been located. Such creation errors do not reflect on  the standard of medical care.

## 2024-03-23 ENCOUNTER — Encounter: Payer: Self-pay | Admitting: Internal Medicine

## 2024-03-23 ENCOUNTER — Other Ambulatory Visit: Payer: Self-pay

## 2024-03-23 ENCOUNTER — Ambulatory Visit: Attending: Internal Medicine | Admitting: Internal Medicine

## 2024-03-23 VITALS — BP 117/80 | HR 96 | Resp 14 | Ht 66.0 in | Wt 129.0 lb

## 2024-03-23 DIAGNOSIS — R911 Solitary pulmonary nodule: Secondary | ICD-10-CM | POA: Insufficient documentation

## 2024-03-23 DIAGNOSIS — Z720 Tobacco use: Secondary | ICD-10-CM | POA: Insufficient documentation

## 2024-03-23 DIAGNOSIS — M25512 Pain in left shoulder: Secondary | ICD-10-CM | POA: Insufficient documentation

## 2024-03-23 DIAGNOSIS — Z79899 Other long term (current) drug therapy: Secondary | ICD-10-CM | POA: Insufficient documentation

## 2024-03-23 DIAGNOSIS — G8929 Other chronic pain: Secondary | ICD-10-CM | POA: Diagnosis not present

## 2024-03-23 DIAGNOSIS — M059 Rheumatoid arthritis with rheumatoid factor, unspecified: Secondary | ICD-10-CM | POA: Diagnosis not present

## 2024-03-23 MED ORDER — HUMIRA (2 PEN) 40 MG/0.4ML ~~LOC~~ AJKT
40.0000 mg | AUTO-INJECTOR | SUBCUTANEOUS | 2 refills | Status: DC
Start: 2024-03-23 — End: 2024-06-13
  Filled 2024-03-23 – 2024-04-02 (×2): qty 2, 28d supply, fill #0
  Filled 2024-04-23 – 2024-04-30 (×2): qty 2, 28d supply, fill #1
  Filled 2024-05-22: qty 2, 28d supply, fill #2

## 2024-03-23 MED ORDER — FOLIC ACID 1 MG PO TABS: 1.0000 mg | ORAL_TABLET | Freq: Every day | ORAL | 3 refills | Status: AC

## 2024-03-23 MED ORDER — METHOTREXATE SODIUM 2.5 MG PO TABS
15.0000 mg | ORAL_TABLET | ORAL | 0 refills | Status: DC
Start: 2024-03-23 — End: 2024-07-09

## 2024-03-24 LAB — LIPID PANEL
Cholesterol: 221 mg/dL — ABNORMAL HIGH (ref <200)
HDL: 56 mg/dL (ref >=50)
LDL Cholesterol (Calc): 136 mg/dL — ABNORMAL HIGH
Non-HDL Cholesterol (Calc): 165 mg/dL — ABNORMAL HIGH (ref <130)
Total CHOL/HDL Ratio: 3.9 (calc) (ref <5.0)
Triglycerides: 156 mg/dL — ABNORMAL HIGH (ref <150)

## 2024-03-24 LAB — COMPREHENSIVE METABOLIC PANEL WITH GFR
AG Ratio: 1.5 (calc) (ref 1.0–2.5)
ALT: 14 U/L (ref 6–29)
AST: 16 U/L (ref 10–35)
Albumin: 4.3 g/dL (ref 3.6–5.1)
Alkaline phosphatase (APISO): 94 U/L (ref 37–153)
BUN: 14 mg/dL (ref 7–25)
CO2: 24 mmol/L (ref 20–32)
Calcium: 9.4 mg/dL (ref 8.6–10.4)
Chloride: 105 mmol/L (ref 98–110)
Creat: 0.57 mg/dL (ref 0.50–1.03)
Globulin: 2.9 g/dL (ref 1.9–3.7)
Glucose, Bld: 99 mg/dL (ref 65–99)
Potassium: 3.8 mmol/L (ref 3.5–5.3)
Sodium: 138 mmol/L (ref 135–146)
Total Bilirubin: 0.3 mg/dL (ref 0.2–1.2)
Total Protein: 7.2 g/dL (ref 6.1–8.1)
eGFR: 109 mL/min/{1.73_m2} (ref >=60)

## 2024-03-24 LAB — CBC WITH DIFFERENTIAL/PLATELET
Absolute Lymphocytes: 3308 {cells}/uL (ref 850–3900)
Absolute Monocytes: 972 {cells}/uL — ABNORMAL HIGH (ref 200–950)
Basophils Absolute: 27 {cells}/uL (ref 0–200)
Basophils Relative: 0.2 %
Eosinophils Absolute: 95 {cells}/uL (ref 15–500)
Eosinophils Relative: 0.7 %
HCT: 38.6 % (ref 35.0–45.0)
Hemoglobin: 12.7 g/dL (ref 11.7–15.5)
MCH: 31.3 pg (ref 27.0–33.0)
MCHC: 32.9 g/dL (ref 32.0–36.0)
MCV: 95.1 fL (ref 80.0–100.0)
MPV: 10.7 fL (ref 7.5–12.5)
Monocytes Relative: 7.2 %
Neutro Abs: 9099 {cells}/uL — ABNORMAL HIGH (ref 1500–7800)
Neutrophils Relative %: 67.4 %
Platelets: 271 10*3/uL (ref 140–400)
RBC: 4.06 10*6/uL (ref 3.80–5.10)
RDW: 12.3 % (ref 11.0–15.0)
Total Lymphocyte: 24.5 %
WBC: 13.5 10*3/uL — ABNORMAL HIGH (ref 3.8–10.8)

## 2024-03-24 LAB — SEDIMENTATION RATE: Sed Rate: 36 mm/h — ABNORMAL HIGH (ref 0–30)

## 2024-04-02 ENCOUNTER — Other Ambulatory Visit: Payer: Self-pay

## 2024-04-02 ENCOUNTER — Encounter (INDEPENDENT_AMBULATORY_CARE_PROVIDER_SITE_OTHER): Payer: Self-pay

## 2024-04-02 ENCOUNTER — Other Ambulatory Visit (HOSPITAL_COMMUNITY): Payer: Self-pay

## 2024-04-02 NOTE — Progress Notes (Signed)
 Specialty Pharmacy Refill Coordination Note  Michelle Ortega is a 53 y.o. female contacted today regarding refills of specialty medication(s) Adalimumab  (Humira  (2 Pen))   Patient requested Delivery   Delivery date: 04/03/24   Verified address: [address] [place] [address]   Medication will be filled on 04/02/24.

## 2024-04-03 ENCOUNTER — Other Ambulatory Visit: Payer: Self-pay

## 2024-04-18 ENCOUNTER — Other Ambulatory Visit: Payer: Self-pay

## 2024-04-23 ENCOUNTER — Other Ambulatory Visit: Payer: Self-pay

## 2024-04-25 ENCOUNTER — Other Ambulatory Visit: Payer: Self-pay

## 2024-04-30 ENCOUNTER — Other Ambulatory Visit: Payer: Self-pay

## 2024-04-30 ENCOUNTER — Ambulatory Visit: Admitting: Family

## 2024-04-30 ENCOUNTER — Encounter: Payer: Self-pay | Admitting: Family

## 2024-04-30 ENCOUNTER — Other Ambulatory Visit: Payer: Self-pay | Admitting: Pharmacy Technician

## 2024-04-30 VITALS — BP 118/73 | HR 86 | Temp 98.1°F | Ht 66.0 in | Wt 126.8 lb

## 2024-04-30 DIAGNOSIS — D71 Functional disorders of polymorphonuclear neutrophils: Secondary | ICD-10-CM

## 2024-04-30 DIAGNOSIS — Z1211 Encounter for screening for malignant neoplasm of colon: Secondary | ICD-10-CM

## 2024-04-30 DIAGNOSIS — F331 Major depressive disorder, recurrent, moderate: Secondary | ICD-10-CM

## 2024-04-30 DIAGNOSIS — Z0001 Encounter for general adult medical examination with abnormal findings: Secondary | ICD-10-CM

## 2024-04-30 DIAGNOSIS — Z720 Tobacco use: Secondary | ICD-10-CM

## 2024-04-30 DIAGNOSIS — Z0000 Encounter for general adult medical examination without abnormal findings: Secondary | ICD-10-CM

## 2024-04-30 DIAGNOSIS — M059 Rheumatoid arthritis with rheumatoid factor, unspecified: Secondary | ICD-10-CM

## 2024-04-30 DIAGNOSIS — E039 Hypothyroidism, unspecified: Secondary | ICD-10-CM | POA: Diagnosis not present

## 2024-04-30 DIAGNOSIS — E559 Vitamin D deficiency, unspecified: Secondary | ICD-10-CM

## 2024-04-30 DIAGNOSIS — Z79899 Other long term (current) drug therapy: Secondary | ICD-10-CM | POA: Diagnosis not present

## 2024-04-30 DIAGNOSIS — I480 Paroxysmal atrial fibrillation: Secondary | ICD-10-CM

## 2024-04-30 DIAGNOSIS — R9389 Abnormal findings on diagnostic imaging of other specified body structures: Secondary | ICD-10-CM | POA: Diagnosis not present

## 2024-04-30 DIAGNOSIS — F411 Generalized anxiety disorder: Secondary | ICD-10-CM

## 2024-04-30 MED ORDER — LEVOTHYROXINE SODIUM 88 MCG PO TABS: 88.0000 ug | ORAL_TABLET | Freq: Every day | ORAL | 1 refills | Status: DC

## 2024-04-30 MED ORDER — VITAMIN D (ERGOCALCIFEROL) 1.25 MG (50000 UNIT) PO CAPS: 50000.0000 [IU] | ORAL_CAPSULE | ORAL | 3 refills | Status: DC

## 2024-04-30 NOTE — Progress Notes (Signed)
 Subjective:    Patient ID: Michelle Ortega, female    DOB: 14-Aug-1971, 53 y.o.   MRN: [id number]  Chief Complaint  Patient presents with   Medical Management of Chronic Issues   Pt presents to the office today for CPE and chronic follow up.    She is followed by Rheumatologists for  RA every 3 months and currently taking Humira  and methotrexate .   Her CT chest 09/05/23 showed, 1. Multiple irregular patchy opacities in both upper lobes. The left upper lobe opacities are similar to the previous shoulder CT. The right upper lobe opacities were not imaged on the prior shoulder CT. Findings are most compatible with sequela from previous granulomatous disease with areas of scarring. However, the densities in the right upper lobe were not imaged on the previous examination and consider 6-12 month follow-up chest CT to ensure stability. 2. No acute lung abnormality. 3. Sequela from previous granulomatous disease in the chest and upper abdomen.  Will order repeat CT today.    She is followed by Cardiologists annually for A Fib and  her sister died of a MI at the age of 71 years old.  Thyroid  Problem Presents for follow-up visit. Symptoms include anxiety, constipation, dry skin and fatigue. Patient reports no hoarse voice. The symptoms have been stable.  Arthritis Presents for follow-up visit. She complains of pain and stiffness. Affected locations include the left MCP and right MCP. Her pain is at a severity of 5/10. Associated symptoms include fatigue.  Anxiety Presents for follow-up visit. Symptoms include excessive worry, nervous/anxious behavior and restlessness. Symptoms occur occasionally. The severity of symptoms is mild.    Depression        This is a chronic problem.  The current episode started more than 1 year ago.   The problem occurs intermittently.  Associated symptoms include fatigue, restlessness and sad.  Associated symptoms include no helplessness and no hopelessness.   Past treatments include nothing.  Past medical history includes thyroid  problem and anxiety.       Review of Systems  Constitutional:  Positive for fatigue.  HENT:  Negative for hoarse voice.   Gastrointestinal:  Positive for constipation.  Musculoskeletal:  Positive for stiffness.  Psychiatric/Behavioral:  The patient is nervous/anxious.   All other systems reviewed and are negative.  Family History  Problem Relation Age of Onset   Hypothyroidism Mother    Hypertension Mother    Diabetes Father    Heart disease Father    Kidney disease Father    Diabetes Sister 48       MI   Heart attack Sister    Diabetes Sister    Diabetes Brother    Depression Daughter    Healthy Son    Social History   Socioeconomic History   Marital status: Divorced    Spouse name: Not on file   Number of children: Not on file   Years of education: Not on file   Highest education level: Associate degree: academic program  Occupational History   Not on file  Tobacco Use   Smoking status: Every Day    Current packs/day: 1.00    Average packs/day: 1 pack/day for 36.7 years (36.7 ttl pk-yrs)    Types: Cigarettes    Start date: 06/06/1988    Passive exposure: Current   Smokeless tobacco: Never  Vaping Use   Vaping status: Never Used  Substance and Sexual Activity   Alcohol use: Yes    Comment: rare  Drug use: Not Currently    Types: Marijuana   Sexual activity: Not on file  Other Topics Concern   Not on file  Social History Narrative   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    Social Drivers of Corporate investment banker Strain: High Risk (04/26/2024)   Overall Financial Resource Strain (CARDIA)    Difficulty of Paying Living Expenses: Very hard  Food Insecurity: Food Insecurity Present (04/26/2024)   Hunger Vital Sign    Worried About Running Out of Food in the Last Year: Often true    Ran Out of Food in the Last Year: Often true  Transportation Needs: No Transportation  Needs (04/26/2024)   PRAPARE - Administrator, Civil Service (Medical): No    Lack of Transportation (Non-Medical): No  Physical Activity: Insufficiently Active (04/26/2024)   Exercise Vital Sign    Days of Exercise per Week: 2 days    Minutes of Exercise per Session: 20 min  Stress: Stress Concern Present (04/26/2024)   [employer] of Occupational Health - Occupational Stress Questionnaire    Feeling of Stress: To some extent  Social Connections: Socially Isolated (04/26/2024)   Social Connection and Isolation Panel    Frequency of Communication with Friends and Family: Once a week    Frequency of Social Gatherings with Friends and Family: Once a week    Attends Religious Services: 1 to 4 times per year    Active Member of [employer] or Organizations: No    Attends Engineer, structural: Not on file    Marital Status: Divorced        Objective:   Physical Exam Vitals reviewed.  Constitutional:      General: She is not in acute distress.    Appearance: She is well-developed.  HENT:     Head: Normocephalic and atraumatic.     Right Ear: Tympanic membrane normal.     Left Ear: Tympanic membrane normal.  Eyes:     Pupils: Pupils are equal, round, and reactive to light.  Neck:     Thyroid : No thyromegaly.  Cardiovascular:     Rate and Rhythm: Normal rate and regular rhythm.     Heart sounds: Normal heart sounds. No murmur heard. Pulmonary:     Effort: Pulmonary effort is normal. No respiratory distress.     Breath sounds: Normal breath sounds. No wheezing.  Abdominal:     General: Bowel sounds are normal. There is no distension.     Palpations: Abdomen is soft.     Tenderness: There is no abdominal tenderness.  Musculoskeletal:        General: No tenderness. Normal range of motion.     Cervical back: Normal range of motion and neck supple.  Skin:    General: Skin is warm and dry.  Neurological:     Mental Status: She is alert and oriented to person,  place, and time.     Cranial Nerves: No cranial nerve deficit.     Deep Tendon Reflexes: Reflexes are normal and symmetric.  Psychiatric:        Behavior: Behavior normal.        Thought Content: Thought content normal.        Judgment: Judgment normal.       BP 118/73   Pulse 86   Temp 98.1 F (36.7 C) (Temporal)   Ht 5' 6 (1.676 m)   Wt 126 lb 12.8 oz (57.5 kg)   SpO2  96%   BMI 20.47 kg/m      Assessment & Plan:  Michelle Ortega comes in today with chief complaint of Medical Management of Chronic Issues   Diagnosis and orders addressed:  1. Granulomatous disease (HCC) - CT Chest Wo Contrast; Future - CBC with Differential/Platelet  2. Abnormal CT of the chest - CT Chest Wo Contrast; Future - CBC with Differential/Platelet  3. Annual physical exam (Primary) - Ambulatory referral to Gastroenterology - CT Chest Wo Contrast; Future - CBC with Differential/Platelet - TSH  4. Hypothyroidism, unspecified type  - levothyroxine  (SYNTHROID ) 88 MCG tablet; Take 1 tablet (88 mcg total) by mouth daily.  Dispense: 90 tablet; Refill: 1 - CBC with Differential/Platelet - TSH  5. Moderate episode of recurrent major depressive disorder (HCC) - CBC with Differential/Platelet  6. Vitamin D  deficiency - Vitamin D , Ergocalciferol , (DRISDOL ) 1.25 MG (50000 UNIT) CAPS capsule; Take 1 capsule (50,000 Units total) by mouth every 7 (seven) days.  Dispense: 12 capsule; Refill: 3 - CBC with Differential/Platelet  7. GAD (generalized anxiety disorder) - CBC with Differential/Platelet  8. Tobacco abuse - CBC with Differential/Platelet  9. PAF (paroxysmal atrial fibrillation) (HCC) - CBC with Differential/Platelet  10. Seropositive rheumatoid arthritis (HCC) - CBC with Differential/Platelet  11. High risk medication use - CBC with Differential/Platelet  12. Colon cancer screening - Ambulatory referral to Gastroenterology - CBC with Differential/Platelet   Labs pending  and labs reviewed  Continue current medications and specialists  Health Maintenance reviewed Diet and exercise encouraged  Follow up plan: 6 months  as CPE with pap   Bari Learn, FNP

## 2024-04-30 NOTE — Patient Instructions (Signed)

## 2024-04-30 NOTE — Progress Notes (Signed)
 Specialty Pharmacy Refill Coordination Note  Michelle Ortega is a 53 y.o. female contacted today regarding refills of specialty medication(s) Adalimumab  (Humira  (2 Pen))   Patient requested Delivery   Delivery date: 05/02/24   Verified address: [address]   [place] [id number]   Medication will be filled on 05/01/24.   UPS

## 2024-05-01 ENCOUNTER — Other Ambulatory Visit: Payer: Self-pay | Admitting: Family

## 2024-05-01 ENCOUNTER — Ambulatory Visit: Payer: Self-pay | Admitting: Family

## 2024-05-01 LAB — CBC WITH DIFFERENTIAL/PLATELET
Basophils Absolute: 0.1 x10E3/uL (ref 0.0–0.2)
Basos: 1 %
EOS (ABSOLUTE): 0 x10E3/uL (ref 0.0–0.4)
Eos: 1 %
Hematocrit: 38.6 % (ref 34.0–46.6)
Hemoglobin: 12.8 g/dL (ref 11.1–15.9)
Immature Grans (Abs): 0 x10E3/uL (ref 0.0–0.1)
Immature Granulocytes: 0 %
Lymphocytes Absolute: 2.9 x10E3/uL (ref 0.7–3.1)
Lymphs: 34 %
MCH: 31.9 pg (ref 26.6–33.0)
MCHC: 33.2 g/dL (ref 31.5–35.7)
MCV: 96 fL (ref 79–97)
Monocytes Absolute: 0.6 x10E3/uL (ref 0.1–0.9)
Monocytes: 7 %
Neutrophils Absolute: 5 x10E3/uL (ref 1.4–7.0)
Neutrophils: 57 %
Platelets: 269 x10E3/uL (ref 150–450)
RBC: 4.01 x10E6/uL (ref 3.77–5.28)
RDW: 12.6 % (ref 11.7–15.4)
WBC: 8.6 x10E3/uL (ref 3.4–10.8)

## 2024-05-01 LAB — TSH: TSH: 9.75 u[IU]/mL — AB (ref 0.450–4.500)

## 2024-05-01 MED ORDER — LEVOTHYROXINE SODIUM 100 MCG PO TABS: 100.0000 ug | ORAL_TABLET | Freq: Every day | ORAL | 1 refills | Status: DC

## 2024-05-22 ENCOUNTER — Other Ambulatory Visit: Payer: Self-pay

## 2024-05-22 ENCOUNTER — Encounter (INDEPENDENT_AMBULATORY_CARE_PROVIDER_SITE_OTHER): Payer: Self-pay

## 2024-05-22 ENCOUNTER — Other Ambulatory Visit: Payer: Self-pay | Admitting: Pharmacy Technician

## 2024-05-22 NOTE — Progress Notes (Signed)
 Specialty Pharmacy Refill Coordination Note  Michelle Ortega is a 53 y.o. female contacted today regarding refills of specialty medication(s) Adalimumab  (Humira  (2 Pen))   Patient requested (Patient-Rptd) Delivery   Delivery date: 05/24/24 Verified address: (Patient-Rptd) [address] [place] [address]   Medication will be filled on 05/23/24.

## 2024-06-12 NOTE — Progress Notes (Deleted)
 Office Visit Note  Patient: Michelle Ortega             Date of Birth: [birth year]           MRN: [id number]             PCP: Lavell Bari LABOR, FNP Referring: Lavell Bari LABOR, FNP Visit Date: 06/25/2024   Subjective:  No chief complaint on file.   History of Present Illness: Michelle Ortega is a 53 y.o. female here for follow up for seropositive RA on Humira  40 mg subcu q. 14 days and methotrexate  15 mg p.o. weekly and folic acid  1 mg daily.     Previous HPI 03/23/2024 Michelle Ortega is a 53 y.o. female here for follow up for seropositive RA on Humira  40 mg subcu q. 14 days and methotrexate  15 mg p.o. weekly and folic acid  1 mg daily.     Her rheumatoid arthritis is mostly well-controlled, but she experiences persistent shoulder pain, particularly in the right shoulder.Previously evaluated and recommended for surgery but has not followed up and scheduled anything due to travel and family commitments.   She describes swelling and soreness in her hands, particularly in the middle finger, which is 'real sore' and sometimes requires her to remove her rings. The swelling is more frequent in the right hand, which she uses more often.   She also reports hip pain, which she attributes to her sleeping position or mattress, noting stiffness in the mornings that lasts about 20-30 minutes and improves with movement. The stiffness is more noticeable in the evenings.   No recent illnesses, viral infections, or the need for antibiotics, except for using an antibiotic cream for a facial issue addressed by a dermatologist.   Previous HPI 08/22/2023 Michelle Ortega is a 53 y.o. female here for follow up for seropositive RA on Humira  40 mg subcu q. 14 days and methotrexate  15 mg p.o. weekly and folic acid  1 mg daily.  Overall her joints are doing pretty well except for ongoing shoulder pain.  Still has pain with use and decreased mobility of her left shoulder.  She went for CT scan in September that showed  severe glenohumeral joint osteoarthritis also incidentally identified 9 mm left upper lobe calcified nodule.  She has not experienced any new chest pain shortness of breath or new cough.  She has a follow-up appointment with her PCP later this week.  She is currently waiting they recommended up to 8 weeks for developing her shoulder model before scheduling for surgery.   Previous HPI 05/19/23 Michelle Ortega is a 53 y.o. female here for follow up for sero positive RA on MTX 15 mg PO weekly and folic acid  1 mg daily and Humira  40 mg [place] q14 days. Symptoms have been doing pretty well with no flare and minimal joint swelling. No infections or problems taking her medication. Left shoulder bothers her a lot cannot raise overhead and gets pain lying on her side. Also pain on right hand 4th-5th fingers mostly.   Previous HPI 02/16/2023 Michelle Ortega is a 53 y.o. female here for follow up for sero positive RA on MTX 15 mg PO weekly and folic acid  1 mg daily and Humira  40 mg  q14days started after last visit. She has not noticed any problems with the injections besides not keeping on pressure and only partially injecting the first dose. Hand pain and swelling is improved much more than on the methotrexate . Left shoulder pain  and stiffness did not improved after intraarticular steroid injection.    Previous HPI 12/17/22 Michelle Ortega is a 53 y.o. female here for follow up for seropositive RA on methotrexate  that was increased to 20 mg p.o. weekly after last visit and folic acid  1 mg daily.  She has not seen much more improvement with persistent right wrist pain and swelling and left shoulder pain are most problematic areas.  Joint elsewhere are doing better.  Since increasing the methotrexate  dose she feels more sick she has had to take it on the weekend and is overall dissatisfied with the benefit on higher dose.  She has not had any serious infections since her last visit.   Previous HPI 09/17/22 Michelle Ortega  is a 53 y.o. female here for follow up for seropositive RA after starting methotrexate  15 mg PO weekly and folic acid  1 mg daily.  Since starting methotrexate  she has seen a decrease in joint swelling there is still a little bit ongoing at the right wrist.  However still has joint pain and stiffness in multiple areas despite the improvement in swelling.  Left shoulder pain is bothering her with some limited range of motion due to pain going more towards the neck and upper back and shoulder.  She had 1 episode of nausea lasting for about 2 days after her third dose of methotrexate  but has taken the medicine 5 times total with no problem on the other 4 occasions.   Previous HPI 07/26/22 Michelle Ortega is a 53 y.o. female here for evaluation of positive rheumatoid factor associated with multiple joint pains. Xrays of cervical spine and shoulders in May showing mild degenerative changes.  Neck and shoulder pain has been going on for least about 2 years in duration.  She first noticed significant increase in pains while in a long car ride to [place]  started having more pain and stiffness that did not fully resolve despite multiple days passing.  As time went on also got increased symptoms affecting her hands and wrists with pain and swelling that started to severely limit activity.  Initially had concern about tickborne illness or infectious cause but did not have any rashes no history of suspicious bite exposures.  Initially symptoms improved but had waxing and waning episodes or flareups over time going on for most of the time.  She has had increased problems in the past 1 year mostly with her hands and wrists becoming the greater problem.  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  She has had previous treatments with anti-inflammatory medications but has never been on drug specifically for rheumatoid arthritis.  Most recently had a good improvement with  diclofenac  but does not completely resolve the pain and swelling.  She had previous shoulder injection with steroids reported that these were extremely painful experiences was more helpful on the right side than the left.  She has a family history of rheumatoid arthritis reports multiple aunts and grandmother. She is a long-term current cigarette smoker.   Labs reviewed 11/2021 RF 483 ESR 115 Uric acid 3.7  No Rheumatology ROS completed.   PMFS History:  Patient Active Problem List   Diagnosis Date Noted   Seropositive rheumatoid arthritis (HCC) 07/26/2022   High risk medication use 07/26/2022   Radiculopathy, cervical region 07/26/2022   Bilateral hand pain 07/26/2022   Tobacco abuse 09/14/2021   PAF (paroxysmal atrial fibrillation) (HCC) 09/14/2021   Tachycardia 07/21/2021  GAD (generalized anxiety disorder) 02/07/2015   Vitamin D  deficiency 01/22/2015   Depression 01/20/2015   Headache 06/06/2013   Hypothyroidism 06/06/2013    Past Medical History:  Diagnosis Date   A-fib (HCC)    Anxiety    Depression    Mild degeneration of cervical intervertebral disc    Pott's disease    Thyroid  disease     Family History  Problem Relation Age of Onset   Hypothyroidism Mother    Hypertension Mother    Diabetes Father    Heart disease Father    Kidney disease Father    Diabetes Sister 52       MI   Heart attack Sister    Diabetes Sister    Diabetes Brother    Depression Daughter    Healthy Son    Past Surgical History:  Procedure Laterality Date   BREAST LUMPECTOMY     KNEE SURGERY Left    TONSILLECTOMY     Social History   Social History Narrative   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    Immunization History  Administered Date(s) Administered   Influenza,inj,Quad PF,6+ Mos 07/27/2014   Td 02/07/2015   Tdap 02/07/2015, 06/20/2020     Objective: Vital Signs: There were no vitals taken for this visit.   Physical Exam   Musculoskeletal Exam:  ***  CDAI Exam: CDAI Score: -- Patient Global: --; Provider Global: -- Swollen: --; Tender: -- Joint Exam 06/25/2024   No joint exam has been documented for this visit   There is currently no information documented on the homunculus. Go to the Rheumatology activity and complete the homunculus joint exam.  Investigation: No additional findings.  Imaging: No results found.  Recent Labs: Lab Results  Component Value Date   WBC 8.6 04/30/2024   HGB 12.8 04/30/2024   PLT 269 04/30/2024   NA 138 03/23/2024   K 3.8 03/23/2024   CL 105 03/23/2024   CO2 24 03/23/2024   GLUCOSE 99 03/23/2024   BUN 14 03/23/2024   CREATININE 0.57 03/23/2024   BILITOT 0.3 03/23/2024   ALKPHOS 130 (H) 12/01/2021   AST 16 03/23/2024   ALT 14 03/23/2024   PROT 7.2 03/23/2024   ALBUMIN 3.8 12/01/2021   CALCIUM 9.4 03/23/2024   GFRAA 131 01/20/2015   QFTBGOLDPLUS NEGATIVE 08/22/2023    Speciality Comments: No specialty comments available.  Procedures:  No procedures performed Allergies: Erythromycin and Penicillins   Assessment / Plan:     Visit Diagnoses: No diagnosis found.  ***  Orders: No orders of the defined types were placed in this encounter.  No orders of the defined types were placed in this encounter.    Follow-Up Instructions: No follow-ups on file.   Serjio Deupree M Kathleen Likins, CMA  Note - This record has been created using Animal nutritionist.  Chart creation errors have been sought, but may not always  have been located. Such creation errors do not reflect on  the standard of medical care.

## 2024-06-13 ENCOUNTER — Other Ambulatory Visit: Payer: Self-pay

## 2024-06-13 ENCOUNTER — Other Ambulatory Visit: Payer: Self-pay | Admitting: Internal Medicine

## 2024-06-13 DIAGNOSIS — Z79899 Other long term (current) drug therapy: Secondary | ICD-10-CM

## 2024-06-13 DIAGNOSIS — M059 Rheumatoid arthritis with rheumatoid factor, unspecified: Secondary | ICD-10-CM

## 2024-06-13 NOTE — Telephone Encounter (Signed)
 Last Fill: 03/23/2024  Labs: 04/30/2024 CBC WNL 03/23/2024 CMP WNL  TB Gold: 08/22/2023 Neg   Next Visit: 06/25/2024  Last Visit: 03/23/2024  IK:Dzmnendpupcz rheumatoid arthritis   Current Dose per office note 03/23/2024: Humira  40 mg subcu q. 14 days   Okay to refill Humira ?

## 2024-06-14 ENCOUNTER — Other Ambulatory Visit (HOSPITAL_COMMUNITY): Payer: Self-pay

## 2024-06-15 ENCOUNTER — Other Ambulatory Visit (HOSPITAL_COMMUNITY): Payer: Self-pay

## 2024-06-15 ENCOUNTER — Other Ambulatory Visit: Payer: Self-pay | Admitting: Internal Medicine

## 2024-06-15 DIAGNOSIS — Z79899 Other long term (current) drug therapy: Secondary | ICD-10-CM

## 2024-06-15 DIAGNOSIS — M059 Rheumatoid arthritis with rheumatoid factor, unspecified: Secondary | ICD-10-CM

## 2024-06-15 MED ORDER — HUMIRA (2 PEN) 40 MG/0.4ML ~~LOC~~ AJKT
40.0000 mg | AUTO-INJECTOR | SUBCUTANEOUS | 0 refills | Status: DC
  Filled 2024-06-19 (×2): qty 2, 28d supply, fill #0

## 2024-06-15 NOTE — Telephone Encounter (Signed)
 Last Fill: 03/23/2024  Labs: 04/30/2024 CBC WNL  03/23/2024 CMP WNL    TB Gold: 08/22/2023 TB Gold Negative   Next Visit: 06/25/2024  Last Visit: 03/23/2024  IK:Dzmnendpupcz rheumatoid arthritis   Current Dose per office note 03/23/2024: adalimumab  (HUMIRA , 2 PEN,) 40 MG/0.4ML pen   Okay to refill Humira ?

## 2024-06-16 ENCOUNTER — Encounter (INDEPENDENT_AMBULATORY_CARE_PROVIDER_SITE_OTHER): Payer: Self-pay

## 2024-06-19 ENCOUNTER — Other Ambulatory Visit: Payer: Self-pay | Admitting: Pharmacy Technician

## 2024-06-19 ENCOUNTER — Other Ambulatory Visit: Payer: Self-pay

## 2024-06-19 NOTE — Progress Notes (Signed)
 Specialty Pharmacy Refill Coordination Note  Michelle Ortega is a 53 y.o. female contacted today regarding refills of specialty medication(s) Adalimumab  (Humira  (2 Pen))   Patient requested Delivery Delivery date: 06/22/24 Verified address: [address] [place] [address]   Medication will be filled on 06/21/24.   Patient answered Questionnaire

## 2024-06-20 ENCOUNTER — Other Ambulatory Visit: Payer: Self-pay

## 2024-06-25 ENCOUNTER — Ambulatory Visit: Admitting: Internal Medicine

## 2024-06-25 DIAGNOSIS — Z79899 Other long term (current) drug therapy: Secondary | ICD-10-CM

## 2024-06-25 DIAGNOSIS — M25512 Pain in left shoulder: Secondary | ICD-10-CM

## 2024-06-25 DIAGNOSIS — M059 Rheumatoid arthritis with rheumatoid factor, unspecified: Secondary | ICD-10-CM

## 2024-06-26 NOTE — Progress Notes (Signed)
 Office Visit Note  Patient: Michelle Ortega             Date of Birth: 05-11-1971           MRN: [id number]             PCP: Lavell Bari LABOR, FNP Referring: Lavell Bari LABOR, FNP Visit Date: 07/09/2024   Subjective:   Discussed the use of AI scribe software for clinical note transcription with the patient, who gave verbal consent to proceed.  History of Present Illness   Michelle Ortega is a 53 y.o. female here for follow up for seropositive RA on Humira  40 mg subcu q. 14 days and methotrexate  15 mg p.o. weekly and folic acid  1 mg daily.     She experiences ongoing joint pain, particularly in the shoulder, with increased pain as the time for her next Humira  dose approaches. She has not yet taken her Humira  dose this week, which is scheduled for Wednesday.  She is currently taking methotrexate , six pills per week, and Humira  every two weeks. Methotrexate  causes significant gastrointestinal side effects, including nausea and vomiting, when taken all at once. To mitigate these effects, she splits the dose, taking two pills per day over three days, but still experiences occasional nausea and vomiting.  Voltaren  previously caused stomach upset, but taking it with food has mitigated this issue. No other medication-related problems are reported.  She has a history of shoulder issues, for which surgery was planned but postponed due to a lung spot that required attention. She is concerned about potential leg involvement but notes she can still walk well, though she tires easily.       Previous HPI 03/23/2024 Michelle Ortega is a 53 y.o. female here for follow up for seropositive RA on Humira  40 mg subcu q. 14 days and methotrexate  15 mg p.o. weekly and folic acid  1 mg daily.     Her rheumatoid arthritis is mostly well-controlled, but she experiences persistent shoulder pain, particularly in the right shoulder.Previously evaluated and recommended for surgery but has not followed up and scheduled  anything due to travel and family commitments.   She describes swelling and soreness in her hands, particularly in the middle finger, which is 'real sore' and sometimes requires her to remove her rings. The swelling is more frequent in the right hand, which she uses more often.   She also reports hip pain, which she attributes to her sleeping position or mattress, noting stiffness in the mornings that lasts about 20-30 minutes and improves with movement. The stiffness is more noticeable in the evenings.   No recent illnesses, viral infections, or the need for antibiotics, except for using an antibiotic cream for a facial issue addressed by a dermatologist.   Previous HPI 08/22/2023 Michelle Ortega is a 53 y.o. female here for follow up for seropositive RA on Humira  40 mg subcu q. 14 days and methotrexate  15 mg p.o. weekly and folic acid  1 mg daily.  Overall her joints are doing pretty well except for ongoing shoulder pain.  Still has pain with use and decreased mobility of her left shoulder.  She went for CT scan in September that showed severe glenohumeral joint osteoarthritis also incidentally identified 9 mm left upper lobe calcified nodule.  She has not experienced any new chest pain shortness of breath or new cough.  She has a follow-up appointment with her PCP later this week.  She is currently waiting they recommended up to  8 weeks for developing her shoulder model before scheduling for surgery.   Previous HPI 05/19/23 Michelle Ortega is a 53 y.o. female here for follow up for sero positive RA on MTX 15 mg PO weekly and folic acid  1 mg daily and Humira  40 mg [place] q14 days. Symptoms have been doing pretty well with no flare and minimal joint swelling. No infections or problems taking her medication. Left shoulder bothers her a lot cannot raise overhead and gets pain lying on her side. Also pain on right hand 4th-5th fingers mostly.   Previous HPI 02/16/2023 Michelle Ortega is a 53 y.o. female here for  follow up for sero positive RA on MTX 15 mg PO weekly and folic acid  1 mg daily and Humira  40 mg [place] q14days started after last visit. She has not noticed any problems with the injections besides not keeping on pressure and only partially injecting the first dose. Hand pain and swelling is improved much more than on the methotrexate . Left shoulder pain and stiffness did not improved after intraarticular steroid injection.    Previous HPI 12/17/22 Michelle Ortega is a 53 y.o. female here for follow up for seropositive RA on methotrexate  that was increased to 20 mg p.o. weekly after last visit and folic acid  1 mg daily.  She has not seen much more improvement with persistent right wrist pain and swelling and left shoulder pain are most problematic areas.  Joint elsewhere are doing better.  Since increasing the methotrexate  dose she feels more sick she has had to take it on the weekend and is overall dissatisfied with the benefit on higher dose.  She has not had any serious infections since her last visit.   Previous HPI 09/17/22 Michelle Ortega is a 53 y.o. female here for follow up for seropositive RA after starting methotrexate  15 mg PO weekly and folic acid  1 mg daily.  Since starting methotrexate  she has seen a decrease in joint swelling there is still a little bit ongoing at the right wrist.  However still has joint pain and stiffness in multiple areas despite the improvement in swelling.  Left shoulder pain is bothering her with some limited range of motion due to pain going more towards the neck and upper back and shoulder.  She had 1 episode of nausea lasting for about 2 days after her third dose of methotrexate  but has taken the medicine 5 times total with no problem on the other 4 occasions.   Previous HPI 07/26/22 Michelle Ortega is a 53 y.o. female here for evaluation of positive rheumatoid factor associated with multiple joint pains. Xrays of cervical spine and shoulders in May showing mild  degenerative changes.  Neck and shoulder pain has been going on for least about 2 years in duration.  She first noticed significant increase in pains while in a long car ride to [place]  started having more pain and stiffness that did not fully resolve despite multiple days passing.  As time went on also got increased symptoms affecting her hands and wrists with pain and swelling that started to severely limit activity.  Initially had concern about tickborne illness or infectious cause but did not have any rashes no history of suspicious bite exposures.  Initially symptoms improved but had waxing and waning episodes or flareups over time going on for most of the time.  She has had increased problems in the past 1 year mostly with her hands and wrists becoming the greater problem.  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  She has had previous treatments with anti-inflammatory medications but has never been on drug specifically for rheumatoid arthritis.  Most recently had a good improvement with diclofenac  but does not completely resolve the pain and swelling.  She had previous shoulder injection with steroids reported that these were extremely painful experiences was more helpful on the right side than the left.  She has a family history of rheumatoid arthritis reports multiple aunts and grandmother. She is a long-term current cigarette smoker.   Labs reviewed 11/2021 RF 483 ESR 115 Uric acid 3.7    Review of Systems  Constitutional:  Positive for fatigue.  HENT:  Positive for mouth dryness. Negative for mouth sores.   Eyes:  Negative for dryness.  Respiratory:  Negative for shortness of breath.   Cardiovascular:  Negative for chest pain and palpitations.  Gastrointestinal:  Positive for constipation and diarrhea. Negative for blood in stool.  Endocrine: Negative for increased urination.  Genitourinary:  Negative for involuntary urination.   Musculoskeletal:  Positive for joint pain, joint pain, joint swelling, myalgias, muscle weakness, morning stiffness, muscle tenderness and myalgias. Negative for gait problem.  Skin:  Positive for hair loss and sensitivity to sunlight. Negative for color change and rash.  Allergic/Immunologic: Negative for susceptible to infections.  Neurological:  Negative for dizziness and headaches.  Hematological:  Positive for swollen glands.  Psychiatric/Behavioral:  Positive for depressed mood and sleep disturbance. The patient is nervous/anxious.     PMFS History:  Patient Active Problem List   Diagnosis Date Noted   Seropositive rheumatoid arthritis (HCC) 07/26/2022   High risk medication use 07/26/2022   Radiculopathy, cervical region 07/26/2022   Bilateral hand pain 07/26/2022   Tobacco abuse 09/14/2021   PAF (paroxysmal atrial fibrillation) (HCC) 09/14/2021   Tachycardia 07/21/2021   GAD (generalized anxiety disorder) 02/07/2015   Vitamin D  deficiency 01/22/2015   Depression 01/20/2015   Headache 06/06/2013   Hypothyroidism 06/06/2013    Past Medical History:  Diagnosis Date   A-fib (HCC)    Anxiety    Depression    Mild degeneration of cervical intervertebral disc    Pott's disease    Thyroid  disease     Family History  Problem Relation Age of Onset   Hypothyroidism Mother    Hypertension Mother    Diabetes Father    Heart disease Father    Kidney disease Father    Diabetes Sister 4       MI   Heart attack Sister    Diabetes Sister    Diabetes Brother    Depression Daughter    Healthy Son    Past Surgical History:  Procedure Laterality Date   BREAST LUMPECTOMY     KNEE SURGERY Left    TONSILLECTOMY     Social History   Social History Narrative   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    Immunization History  Administered Date(s) Administered   Influenza,inj,Quad PF,6+ Mos 07/27/2014   Td 02/07/2015   Tdap 02/07/2015, 06/20/2020      Objective: Vital Signs: BP 131/88   Pulse 90   Temp 97.7 F (36.5 C)   Resp 14   Ht 5' 6.25 (1.683 m)   Wt 126 lb 6.4 oz (57.3 kg)   BMI 20.25 kg/m    Physical Exam Eyes:     Conjunctiva/sclera: Conjunctivae normal.  Cardiovascular:     Rate  and Rhythm: Normal rate and regular rhythm.  Pulmonary:     Effort: Pulmonary effort is normal.     Breath sounds: Normal breath sounds.  Lymphadenopathy:     Cervical: No cervical adenopathy.  Skin:    General: Skin is warm and dry.  Neurological:     Mental Status: She is alert.  Psychiatric:        Mood and Affect: Mood normal.      Musculoskeletal Exam:  Left shoulder tenderness to pressure unable to abduct significantly above head limited with hard endpoint and with pain, external rotation also limited even while at side Elbows full ROM no tenderness or swelling Right wrist with prominent ulnar styloid and mild subluxation no palpable swelling or tenderness to pressure, full range of motion intact Fingers full ROM, right third MCP joint tenderness, no synovitis Knees full ROM no tenderness or swelling Ankles full ROM no tenderness or swelling  Investigation: No additional findings.  Imaging: No results found.  Recent Labs: Lab Results  Component Value Date   WBC 10.9 (H) 07/12/2024   HGB 13.1 07/12/2024   PLT 302 07/12/2024   NA 139 07/12/2024   K 4.1 07/12/2024   CL 102 07/12/2024   CO2 20 07/12/2024   GLUCOSE 113 (H) 07/12/2024   BUN 9 07/12/2024   CREATININE 0.65 07/12/2024   BILITOT 0.2 07/12/2024   ALKPHOS 112 07/12/2024   AST 12 07/12/2024   ALT 8 07/12/2024   PROT 7.2 07/12/2024   ALBUMIN 4.1 07/12/2024   CALCIUM 9.5 07/12/2024   GFRAA 131 01/20/2015   QFTBGOLDPLUS NEGATIVE 08/22/2023    Speciality Comments: No specialty comments available.  Procedures:  No procedures performed Allergies: Erythromycin and Penicillins   Assessment / Plan:     Visit Diagnoses: Seropositive rheumatoid  arthritis (HCC) - Plan: XR Hand 2 View Left, XR Hand 2 View Right, methotrexate  (RHEUMATREX) 2.5 MG tablet, Adalimumab +Ab (Serial Monitor), Sedimentation rate Rheumatoid arthritis with ongoing joint pain and issues with methotrexate  administration. Experiences nausea and vomiting when taking methotrexate  all at once, so she splits the dose throughout the week. Reports increased joint pain as the Humira  dose wears off, suggesting suboptimal control of her symptoms. Shoulder pain noted, but surgical intervention postponed due to concerns about a lung spot. - Update hand xrays for evidence of radiographic progression particularly with chronic wrist changes - Order Humira  trough level blood test before the next dose to assess drug levels. - Provide [facility] order printout for blood test to be done at her regular doctor's office or any [facility] location. - Consider increasing Humira  dose if trough levels are low. - Consider switching to a different biologic if Humira  levels are adequate but symptoms persist. - Discuss potential reduction of methotrexate  to four pills a week if Humira  is effective, to minimize side effects.   High risk medication use - methotrexate  15 mg p.o. weekly folic acid  1 mg dailyHumira 40 mg subcu q. 14 days - Plan: CBC with Differential/Platelet, Comprehensive Metabolic Panel (CMET) - Checking CBC and CMP for medication monitoring on continued use of Humira  and methotrexate    Orders: Orders Placed This Encounter  Procedures   XR Hand 2 View Left   XR Hand 2 View Right   Adalimumab +Ab (Serial Monitor)   Sedimentation rate   CBC with Differential/Platelet   Comprehensive Metabolic Panel (CMET)   Meds ordered this encounter  Medications   methotrexate  (RHEUMATREX) 2.5 MG tablet    Sig: Take 4 tablets (10 mg total) by  mouth once a week. Caution:Chemotherapy. Protect from light.    Dispense:  52 tablet    Refill:  0     Follow-Up Instructions: Return in about 3 months  (around 10/08/2024) for RA on ADA/MTX f/u 3mos.   Lonni LELON Ester, MD  Note - This record has been created using [employer].  Chart creation errors have been sought, but may not always  have been located. Such creation errors do not reflect on  the standard of medical care.

## 2024-07-09 ENCOUNTER — Encounter: Payer: Self-pay | Admitting: Internal Medicine

## 2024-07-09 ENCOUNTER — Ambulatory Visit

## 2024-07-09 ENCOUNTER — Ambulatory Visit: Attending: Internal Medicine | Admitting: Internal Medicine

## 2024-07-09 VITALS — BP 131/88 | HR 90 | Temp 97.7°F | Resp 14 | Ht 66.25 in | Wt 126.4 lb

## 2024-07-09 DIAGNOSIS — M059 Rheumatoid arthritis with rheumatoid factor, unspecified: Secondary | ICD-10-CM | POA: Insufficient documentation

## 2024-07-09 DIAGNOSIS — Z79899 Other long term (current) drug therapy: Secondary | ICD-10-CM | POA: Insufficient documentation

## 2024-07-09 MED ORDER — METHOTREXATE SODIUM 2.5 MG PO TABS: 10.0000 mg | ORAL_TABLET | ORAL | 0 refills | Status: DC

## 2024-07-11 ENCOUNTER — Other Ambulatory Visit (HOSPITAL_COMMUNITY): Payer: Self-pay

## 2024-07-12 ENCOUNTER — Other Ambulatory Visit

## 2024-07-16 ENCOUNTER — Other Ambulatory Visit: Payer: Self-pay

## 2024-07-16 ENCOUNTER — Other Ambulatory Visit: Payer: Self-pay | Admitting: Internal Medicine

## 2024-07-16 DIAGNOSIS — Z79899 Other long term (current) drug therapy: Secondary | ICD-10-CM

## 2024-07-16 DIAGNOSIS — M059 Rheumatoid arthritis with rheumatoid factor, unspecified: Secondary | ICD-10-CM

## 2024-07-16 MED ORDER — HUMIRA (2 PEN) 40 MG/0.4ML ~~LOC~~ AJKT
40.0000 mg | AUTO-INJECTOR | SUBCUTANEOUS | 2 refills | Status: DC
  Filled 2024-07-16 – 2024-07-17 (×2): qty 2, 28d supply, fill #0
  Filled 2024-08-13: qty 2, 28d supply, fill #1
  Filled 2024-09-10: qty 2, 28d supply, fill #2

## 2024-07-16 NOTE — Telephone Encounter (Signed)
 Last Fill: 06/15/2024 (30 day supply)  Labs: 07/12/2024 Glucose 113, WBC 10.9, Lymphocytes Absolute 3.8  TB Gold: 08/22/2023 Neg    Next Visit: 10/22/2024  Last Visit: 06/19/2024  IK:Dzmnendpupcz rheumatoid arthritis  Current Dose per office note 07/09/2024: Humira  40 mg subcu q. 14 days   Okay to refill Humira ?

## 2024-07-17 ENCOUNTER — Other Ambulatory Visit: Payer: Self-pay | Admitting: Pharmacy Technician

## 2024-07-17 ENCOUNTER — Encounter (INDEPENDENT_AMBULATORY_CARE_PROVIDER_SITE_OTHER): Payer: Self-pay

## 2024-07-17 ENCOUNTER — Other Ambulatory Visit: Payer: Self-pay

## 2024-07-17 NOTE — Progress Notes (Signed)
 Specialty Pharmacy Refill Coordination Note  Michelle Ortega is a 53 y.o. female contacted today regarding refills of specialty medication(s) Adalimumab  (Humira  (2 Pen))   Patient requested (Patient-Rptd) Delivery   Delivery date: 07/20/24 Verified address: (Patient-Rptd) [address] [place] [address]   Medication will be filled on 07/19/24.

## 2024-07-18 ENCOUNTER — Other Ambulatory Visit: Payer: Self-pay

## 2024-07-23 ENCOUNTER — Encounter: Payer: Self-pay | Admitting: Family

## 2024-07-28 LAB — SERIAL MONITORING

## 2024-07-31 LAB — CBC WITH DIFFERENTIAL/PLATELET
Basophils Absolute: 0 x10E3/uL (ref 0.0–0.2)
Basos: 0 %
EOS (ABSOLUTE): 0.1 x10E3/uL (ref 0.0–0.4)
Eos: 1 %
Hematocrit: 39.9 % (ref 34.0–46.6)
Hemoglobin: 13.1 g/dL (ref 11.1–15.9)
Immature Grans (Abs): 0 x10E3/uL (ref 0.0–0.1)
Immature Granulocytes: 0 %
Lymphocytes Absolute: 3.8 x10E3/uL — ABNORMAL HIGH (ref 0.7–3.1)
Lymphs: 35 %
MCH: 31.6 pg (ref 26.6–33.0)
MCHC: 32.8 g/dL (ref 31.5–35.7)
MCV: 96 fL (ref 79–97)
Monocytes Absolute: 0.6 x10E3/uL (ref 0.1–0.9)
Monocytes: 6 %
Neutrophils Absolute: 6.3 x10E3/uL (ref 1.4–7.0)
Neutrophils: 58 %
Platelets: 302 x10E3/uL (ref 150–450)
RBC: 4.14 x10E6/uL (ref 3.77–5.28)
RDW: 12.2 % (ref 11.7–15.4)
WBC: 10.9 x10E3/uL — ABNORMAL HIGH (ref 3.4–10.8)

## 2024-07-31 LAB — COMPREHENSIVE METABOLIC PANEL WITH GFR
ALT: 8 IU/L (ref 0–32)
AST: 12 IU/L (ref 0–40)
Albumin: 4.1 g/dL (ref 3.8–4.9)
Alkaline Phosphatase: 112 IU/L (ref 49–135)
BUN/Creatinine Ratio: 14 (ref 9–23)
BUN: 9 mg/dL (ref 6–24)
Bilirubin Total: 0.2 mg/dL (ref 0.0–1.2)
CO2: 20 mmol/L (ref 20–29)
Calcium: 9.5 mg/dL (ref 8.7–10.2)
Chloride: 102 mmol/L (ref 96–106)
Creatinine, Ser: 0.65 mg/dL (ref 0.57–1.00)
Globulin, Total: 3.1 g/dL (ref 1.5–4.5)
Glucose: 113 mg/dL — ABNORMAL HIGH (ref 70–99)
Potassium: 4.1 mmol/L (ref 3.5–5.2)
Sodium: 139 mmol/L (ref 134–144)
Total Protein: 7.2 g/dL (ref 6.0–8.5)
eGFR: 105 mL/min/1.73 (ref >59)

## 2024-07-31 LAB — ADALIMUMAB+AB (SERIAL MONITOR)
Adalimumab Drug Level: 8.6 ug/mL
Anti-Adalimumab Antibody: <25 ng/mL

## 2024-07-31 LAB — SEDIMENTATION RATE: Sed Rate: 44 mm/h — ABNORMAL HIGH (ref 0–40)

## 2024-08-10 ENCOUNTER — Encounter (INDEPENDENT_AMBULATORY_CARE_PROVIDER_SITE_OTHER): Payer: Self-pay

## 2024-08-13 ENCOUNTER — Other Ambulatory Visit: Payer: Self-pay

## 2024-08-13 NOTE — Progress Notes (Signed)
 Specialty Pharmacy Refill Coordination Note  Michelle Ortega is a 53 y.o. female contacted today regarding refills of specialty medication(s) Adalimumab  (Humira  (2 Pen))   Patient requested Delivery   Delivery date: 08/15/24   Verified address: [address] [place] [address]   Medication will be filled on: 08/14/24  Next Injection: 11.05.25

## 2024-08-15 ENCOUNTER — Other Ambulatory Visit: Payer: Self-pay

## 2024-08-15 ENCOUNTER — Other Ambulatory Visit (HOSPITAL_COMMUNITY): Payer: Self-pay

## 2024-08-16 ENCOUNTER — Other Ambulatory Visit: Payer: Self-pay

## 2024-08-16 ENCOUNTER — Other Ambulatory Visit: Payer: Self-pay | Admitting: Medical Genetics

## 2024-08-16 DIAGNOSIS — Z006 Encounter for examination for normal comparison and control in clinical research program: Secondary | ICD-10-CM

## 2024-08-16 NOTE — Progress Notes (Signed)
 Patient called pharmacy last night regarding delivery. Checked [employer] tracking and anticipated delivery is by 3pm today (10.30.25).

## 2024-09-04 ENCOUNTER — Other Ambulatory Visit (HOSPITAL_COMMUNITY): Payer: Self-pay

## 2024-09-05 ENCOUNTER — Other Ambulatory Visit: Payer: Self-pay

## 2024-09-10 ENCOUNTER — Other Ambulatory Visit: Payer: Self-pay

## 2024-09-11 ENCOUNTER — Other Ambulatory Visit: Payer: Self-pay

## 2024-09-12 ENCOUNTER — Other Ambulatory Visit: Payer: Self-pay

## 2024-09-12 NOTE — Progress Notes (Signed)
 Specialty Pharmacy Refill Coordination Note  Michelle Ortega is a 53 y.o. female contacted today regarding refills of specialty medication(s) Adalimumab  (Humira  (2 Pen))   Patient requested Delivery   Delivery date: 09/18/24   Verified address: [address] [place] [address]   Medication will be filled on: 09/17/24

## 2024-09-17 ENCOUNTER — Other Ambulatory Visit: Payer: Self-pay

## 2024-10-08 ENCOUNTER — Other Ambulatory Visit: Payer: Self-pay

## 2024-10-08 ENCOUNTER — Other Ambulatory Visit: Payer: Self-pay | Admitting: Internal Medicine

## 2024-10-08 DIAGNOSIS — Z79899 Other long term (current) drug therapy: Secondary | ICD-10-CM

## 2024-10-08 DIAGNOSIS — M059 Rheumatoid arthritis with rheumatoid factor, unspecified: Secondary | ICD-10-CM

## 2024-10-08 MED ORDER — HUMIRA (2 PEN) 40 MG/0.4ML ~~LOC~~ AJKT
40.0000 mg | AUTO-INJECTOR | SUBCUTANEOUS | 0 refills | Status: DC
  Filled 2024-10-08 – 2024-10-10 (×2): qty 0.8, 28d supply, fill #0

## 2024-10-08 NOTE — Telephone Encounter (Signed)
 Last Fill: 07/16/2024  Labs: 07/12/2024 (CMP): Glucose:113, (CBC): WBC:10.9, Lymphocytes Absolute:3.8,   TB Gold: 08/22/2023  Neg.  Next Visit: 10/22/2024  Last Visit: 07/09/2024  IK:Dzmnendpupcz rheumatoid arthritis   Current Dose per office note 07/09/2024: Humira  40 mg subcu q. 14 days   Patient can update labs at up coming appointment on 10/23/2023.  Okay to refill Humira ?

## 2024-10-08 NOTE — Progress Notes (Signed)
 "  Office Visit Note  Patient: Michelle Ortega             Date of Birth: 06/29/1971           MRN: [id number]             PCP: Lavell Bari LABOR, FNP Referring: Lavell Bari LABOR, FNP Visit Date: 10/22/2024   Subjective:   Discussed the use of AI scribe software for clinical note transcription with the patient, who gave verbal consent to proceed.  History of Present Illness   Michelle Ortega is a 53 y.o. female here for follow up for seropositive RA on Humira  40 mg subcu q. 14 days and methotrexate  15 mg p.o. weekly and folic acid  1 mg daily.      She experiences ongoing joint pain and swelling, particularly in her wrists and shoulders, despite current treatment with Humira . The left shoulder is more painful than the right, which was previously considered for surgery. The pain significantly impacts her daily activities, including difficulty typing and lifting her grandchild.  She underwent x-rays of her hands but has not received the results. Recent lab tests indicate elevated inflammation markers, with a sed rate of 43-45, despite normal Humira  levels at 8.6. She is frustrated with the persistent pain and the lack of improvement with current medication.  She has a history of receiving multiple corticosteroid injections in her shoulders, which provided only temporary relief. She has been on Humira  for a while, but reports ongoing symptoms despite treatment.  She is currently taking methotrexate  and Humira , with the latter being administered every two weeks. She mentions fluctuations in her weight, which she attributes to the one-size-fits-all dosing of her medication.  She discusses her limitations in performing work-related tasks due to her joint issues, particularly in her hands and shoulders. 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 She is interested in exploring assessment for disability due to symptoms.      Previous  HPI 07/09/2024 Michelle Ortega is a 53 y.o. female here for follow up for seropositive RA on Humira  40 mg subcu q. 14 days and methotrexate  15 mg p.o. weekly and folic acid  1 mg daily.      She experiences ongoing joint pain, particularly in the shoulder, with increased pain as the time for her next Humira  dose approaches. She has not yet taken her Humira  dose this week, which is scheduled for Wednesday.   She is currently taking methotrexate , six pills per week, and Humira  every two weeks. Methotrexate  causes significant gastrointestinal side effects, including nausea and vomiting, when taken all at once. To mitigate these effects, she splits the dose, taking two pills per day over three days, but still experiences occasional nausea and vomiting.   Voltaren  previously caused stomach upset, but taking it with food has mitigated this issue. No other medication-related problems are reported.   She has a history of shoulder issues, for which surgery was planned but postponed due to a lung spot that required attention. She is concerned about potential leg involvement but notes she can still walk well, though she tires easily.     Previous HPI 03/23/2024 Michelle Ortega is a 53 y.o. female here for follow up for seropositive RA on Humira  40 mg subcu q. 14 days and methotrexate  15 mg p.o. weekly and folic acid  1 mg daily.     Her rheumatoid arthritis is mostly well-controlled, but she experiences persistent shoulder pain,  particularly in the right shoulder.Previously evaluated and recommended for surgery but has not followed up and scheduled anything due to travel and family commitments.   She describes swelling and soreness in her hands, particularly in the middle finger, which is 'real sore' and sometimes requires her to remove her rings. The swelling is more frequent in the right hand, which she uses more often.   She also reports hip pain, which she attributes to her sleeping position or mattress,  noting stiffness in the mornings that lasts about 20-30 minutes and improves with movement. The stiffness is more noticeable in the evenings.   No recent illnesses, viral infections, or the need for antibiotics, except for using an antibiotic cream for a facial issue addressed by a dermatologist.   Previous HPI 08/22/2023 Michelle Ortega is a 53 y.o. female here for follow up for seropositive RA on Humira  40 mg subcu q. 14 days and methotrexate  15 mg p.o. weekly and folic acid  1 mg daily.  Overall her joints are doing pretty well except for ongoing shoulder pain.  Still has pain with use and decreased mobility of her left shoulder.  She went for CT scan in September that showed severe glenohumeral joint osteoarthritis also incidentally identified 9 mm left upper lobe calcified nodule.  She has not experienced any new chest pain shortness of breath or new cough.  She has a follow-up appointment with her PCP later this week.  She is currently waiting they recommended up to 8 weeks for developing her shoulder model before scheduling for surgery.   Previous HPI 05/19/23 Michelle Ortega is a 53 y.o. female here for follow up for sero positive RA on MTX 15 mg PO weekly and folic acid  1 mg daily and Humira  40 mg [place] q14 days. Symptoms have been doing pretty well with no flare and minimal joint swelling. No infections or problems taking her medication. Left shoulder bothers her a lot cannot raise overhead and gets pain lying on her side. Also pain on right hand 4th-5th fingers mostly.   Previous HPI 02/16/2023 Michelle Ortega is a 53 y.o. female here for follow up for sero positive RA on MTX 15 mg PO weekly and folic acid  1 mg daily and Humira  40 mg  q14days started after last visit. She has not noticed any problems with the injections besides not keeping on pressure and only partially injecting the first dose. Hand pain and swelling is improved much more than on the methotrexate . Left shoulder pain and stiffness  did not improved after intraarticular steroid injection.    Previous HPI 12/17/22 Michelle Ortega is a 53 y.o. female here for follow up for seropositive RA on methotrexate  that was increased to 20 mg p.o. weekly after last visit and folic acid  1 mg daily.  She has not seen much more improvement with persistent right wrist pain and swelling and left shoulder pain are most problematic areas.  Joint elsewhere are doing better.  Since increasing the methotrexate  dose she feels more sick she has had to take it on the weekend and is overall dissatisfied with the benefit on higher dose.  She has not had any serious infections since her last visit.   Previous HPI 09/17/22 CHARLOTTA LAPAGLIA is a 53 y.o. female here for follow up for seropositive RA after starting methotrexate  15 mg PO weekly and folic acid  1 mg daily.  Since starting methotrexate  she has seen a decrease in joint swelling there is still a little  bit ongoing at the right wrist.  However still has joint pain and stiffness in multiple areas despite the improvement in swelling.  Left shoulder pain is bothering her with some limited range of motion due to pain going more towards the neck and upper back and shoulder.  She had 1 episode of nausea lasting for about 2 days after her third dose of methotrexate  but has taken the medicine 5 times total with no problem on the other 4 occasions.   Previous HPI 07/26/22 TAURI ETHINGTON is a 53 y.o. female here for evaluation of positive rheumatoid factor associated with multiple joint pains. Xrays of cervical spine and shoulders in May showing mild degenerative changes.  Neck and shoulder pain has been going on for least about 2 years in duration.  She first noticed significant increase in pains while in a long car ride to [place]  started having more pain and stiffness that did not fully resolve despite multiple days passing.  As time went on also got increased symptoms affecting her hands and wrists with pain and  swelling that started to severely limit activity.  Initially had concern about tickborne illness or infectious cause but did not have any rashes no history of suspicious bite exposures.  Initially symptoms improved but had waxing and waning episodes or flareups over time going on for most of the time.  She has had increased problems in the past 1 year mostly with her hands and wrists becoming the greater problem.  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  She has had previous treatments with anti-inflammatory medications but has never been on drug specifically for rheumatoid arthritis.  Most recently had a good improvement with diclofenac  but does not completely resolve the pain and swelling.  She had previous shoulder injection with steroids reported that these were extremely painful experiences was more helpful on the right side than the left.  She has a family history of rheumatoid arthritis reports multiple aunts and grandmother. She is a long-term current cigarette smoker.   Labs reviewed 11/2021 RF 483 ESR 115 Uric acid 3.7   Review of Systems  Constitutional:  Positive for fatigue.  HENT:  Negative for mouth sores and mouth dryness.   Respiratory:  Negative for shortness of breath.   Cardiovascular:  Negative for chest pain and palpitations.  Gastrointestinal:  Positive for constipation and diarrhea. Negative for blood in stool.  Endocrine: Negative for increased urination.  Genitourinary:  Positive for involuntary urination.  Musculoskeletal:  Positive for joint pain, joint pain, joint swelling, myalgias, muscle weakness, morning stiffness, muscle tenderness and myalgias. Negative for gait problem.  Skin:  Positive for hair loss and sensitivity to sunlight. Negative for color change and rash.  Allergic/Immunologic: Positive for susceptible to infections.  Neurological:  Positive for headaches. Negative for dizziness.   Hematological:  Negative for swollen glands.  Psychiatric/Behavioral:  Positive for depressed mood and sleep disturbance. The patient is nervous/anxious.     PMFS History:  Patient Active Problem List   Diagnosis Date Noted   Seropositive rheumatoid arthritis (HCC) 07/26/2022   High risk medication use 07/26/2022   Radiculopathy, cervical region 07/26/2022   Bilateral hand pain 07/26/2022   Tobacco abuse 09/14/2021   PAF (paroxysmal atrial fibrillation) (HCC) 09/14/2021   Tachycardia 07/21/2021   GAD (generalized anxiety disorder) 02/07/2015   Vitamin D  deficiency 01/22/2015   Depression 01/20/2015   Headache 06/06/2013   Hypothyroidism 06/06/2013  Past Medical History:  Diagnosis Date   A-fib (HCC)    Anxiety    Depression    Mild degeneration of cervical intervertebral disc    Pott's disease    Thyroid  disease     Family History  Problem Relation Age of Onset   Hypothyroidism Mother    Hypertension Mother    Diabetes Father    Heart disease Father    Kidney disease Father    Diabetes Sister 33       MI   Heart attack Sister    Diabetes Sister    Diabetes Brother    Depression Daughter    Healthy Son    Past Surgical History:  Procedure Laterality Date   BREAST LUMPECTOMY     KNEE SURGERY Left    TONSILLECTOMY     Social History   Social History Narrative   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    Immunization History  Administered Date(s) Administered   Influenza,inj,Quad PF,6+ Mos 07/27/2014   Td 02/07/2015   Tdap 02/07/2015, 06/20/2020   Zoster Recombinant(Shingrix ) 11/01/2024     Objective: Vital Signs: BP (!) 138/92   Pulse 99   Temp 98 F (36.7 C)   Resp 16   Ht 5' 6.25 (1.683 m)   Wt 118 lb 9.6 oz (53.8 kg)   BMI 19.00 kg/m    Physical Exam Eyes:     Conjunctiva/sclera: Conjunctivae normal.  Cardiovascular:     Rate and Rhythm: Normal rate and regular rhythm.  Pulmonary:     Effort: Pulmonary effort is normal.      Breath sounds: Normal breath sounds.  Lymphadenopathy:     Cervical: No cervical adenopathy.  Skin:    General: Skin is warm and dry.  Neurological:     Mental Status: She is alert.  Psychiatric:        Mood and Affect: Mood normal.      Musculoskeletal Exam:  Left shoulder tenderness to pressure unable to abduct significantly above head limited with hard endpoint and with pain, external rotation also limited even while at side, right shoudler painful Elbows full ROM no tenderness or swelling Right wrist with prominent ulnar styloid and mild subluxation no palpable swelling or tenderness to pressure, full range of motion intact Fingers full ROM, right third MCP joint tenderness, no synovitis Knees full ROM no tenderness or swelling Ankles full ROM no tenderness or swelling   Investigation: No additional findings.  Imaging: No results found.  Recent Labs: Lab Results  Component Value Date   WBC 9.8 10/22/2024   HGB 13.6 10/22/2024   PLT 322 10/22/2024   NA 136 10/22/2024   K 4.1 10/22/2024   CL 101 10/22/2024   CO2 24 10/22/2024   GLUCOSE 92 10/22/2024   BUN 10 10/22/2024   CREATININE 0.54 10/22/2024   BILITOT 0.3 10/22/2024   ALKPHOS 112 07/12/2024   AST 14 10/22/2024   ALT 10 10/22/2024   PROT 8.0 10/22/2024   ALBUMIN 4.1 07/12/2024   CALCIUM 9.7 10/22/2024   GFRAA 131 01/20/2015   QFTBGOLDPLUS NEGATIVE 10/22/2024    Speciality Comments: No specialty comments available.  Procedures:  No procedures performed Allergies: Erythromycin and Penicillins   Assessment / Plan:     Visit Diagnoses: Seropositive rheumatoid arthritis (HCC) - Plan: Sedimentation rate, methotrexate  (RHEUMATREX) 2.5 MG tablet Persistent inflammation despite adequate Humira  levels. Current treatment ineffective. Considering switch to Actemra due to different inflammation pathway and lower infection risk. Discussed potential side  effects and alternative options, preferring Actemra for lower  risk profile. - Switch from Humira  to Actemra (tocilizumab) 162 mg [place] q14days after completing current Humira  supply. - Continue methotrexate  10 mg PO weekly and folic acid  1 mg daily - Monitor white blood cell count and cholesterol levels. - Checking sed rate for disease monitoring - Advised to report any major flare-ups during transition.  High risk medication use - methotrexate  15 mg p.o. weekly folic acid  1 mg dailyHumira 40 mg subcu q. 14 days - Plan: CBC with Differential/Platelet, Comprehensive metabolic panel with GFR, QuantiFERON-TB Gold Plus No serious interval infections. Discussed risks of actemra including injeciton reactions, infections, cytopenias, hyperlipidemia. - Checking CBC and CMP for medication monitoring on Humira  and planned switch to Actemra and for methotrexate   Chronic shoulder arthritis with OA, inflammation, and tendinopathy Chronic shoulder pain with previous ineffective corticosteroid injections. Surgery postponed due to personal circumstances. 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 - Referred for functional capacity exam (FCE) to assess work capabilities and disability status.       Orders: Orders Placed This Encounter  Procedures   Sedimentation rate   CBC with Differential/Platelet   Comprehensive metabolic panel with GFR   QuantiFERON-TB Gold Plus   Meds ordered this encounter  Medications   methotrexate  (RHEUMATREX) 2.5 MG tablet    Sig: Take 4 tablets (10 mg total) by mouth once a week. Caution:Chemotherapy. Protect from light.    Dispense:  52 tablet    Refill:  0     Follow-Up Instructions: Return in about 3 months (around 01/20/2025) for RA on MTX/TOC switch f/u 3mos.   Lonni LELON Ester, MD  Note - This record has been created using [employer].  Chart creation errors have been sought, but may not  always  have been located. Such creation errors do not reflect on  the standard of medical care. "

## 2024-10-10 ENCOUNTER — Encounter (INDEPENDENT_AMBULATORY_CARE_PROVIDER_SITE_OTHER): Payer: Self-pay

## 2024-10-10 ENCOUNTER — Other Ambulatory Visit: Payer: Self-pay

## 2024-10-10 ENCOUNTER — Other Ambulatory Visit: Payer: Self-pay | Admitting: Pharmacy Technician

## 2024-10-10 NOTE — Progress Notes (Signed)
 Specialty Pharmacy Refill Coordination Note  Michelle Ortega is a 53 y.o. female contacted today regarding refills of specialty medication(s) Adalimumab  (Humira  (2 Pen))   Patient requested Delivery   Delivery date: 10-16-2024   Verified address: [address] [place] [address]   Medication will be filled on: 10/15/24

## 2024-10-12 ENCOUNTER — Other Ambulatory Visit: Payer: Self-pay

## 2024-10-15 ENCOUNTER — Other Ambulatory Visit: Payer: Self-pay

## 2024-10-22 ENCOUNTER — Ambulatory Visit: Attending: Internal Medicine | Admitting: Internal Medicine

## 2024-10-22 ENCOUNTER — Encounter: Payer: Self-pay | Admitting: Internal Medicine

## 2024-10-22 VITALS — BP 138/92 | HR 99 | Temp 98.0°F | Resp 16 | Ht 66.25 in | Wt 118.6 lb

## 2024-10-22 DIAGNOSIS — M059 Rheumatoid arthritis with rheumatoid factor, unspecified: Secondary | ICD-10-CM | POA: Diagnosis present

## 2024-10-22 DIAGNOSIS — Z79899 Other long term (current) drug therapy: Secondary | ICD-10-CM | POA: Diagnosis present

## 2024-10-22 MED ORDER — METHOTREXATE SODIUM 2.5 MG PO TABS: 10.0000 mg | ORAL_TABLET | ORAL | 0 refills | Status: AC

## 2024-10-22 NOTE — Patient Instructions (Signed)
 SABRA

## 2024-10-24 ENCOUNTER — Ambulatory Visit: Payer: Self-pay | Admitting: Internal Medicine

## 2024-10-24 LAB — CBC WITH DIFFERENTIAL/PLATELET
Absolute Lymphocytes: 3401 {cells}/uL (ref 850–3900)
Absolute Monocytes: 559 {cells}/uL (ref 200–950)
Basophils Absolute: 39 {cells}/uL (ref 0–200)
Basophils Relative: 0.4 %
Eosinophils Absolute: 59 {cells}/uL (ref 15–500)
Eosinophils Relative: 0.6 %
HCT: 41.4 % (ref 35.9–46.0)
Hemoglobin: 13.6 g/dL (ref 11.7–15.5)
MCH: 30.4 pg (ref 27.0–33.0)
MCHC: 32.9 g/dL (ref 31.6–35.4)
MCV: 92.4 fL (ref 81.4–101.7)
MPV: 10.1 fL (ref 7.5–12.5)
Monocytes Relative: 5.7 %
Neutro Abs: 5743 {cells}/uL (ref 1500–7800)
Neutrophils Relative %: 58.6 %
Platelets: 322 Thousand/uL (ref 140–400)
RBC: 4.48 Million/uL (ref 3.80–5.10)
RDW: 12.4 % (ref 11.0–15.0)
Total Lymphocyte: 34.7 %
WBC: 9.8 Thousand/uL (ref 3.8–10.8)

## 2024-10-24 LAB — COMPREHENSIVE METABOLIC PANEL WITH GFR
AG Ratio: 1.5 (calc) (ref 1.0–2.5)
ALT: 10 U/L (ref 6–29)
AST: 14 U/L (ref 10–35)
Albumin: 4.8 g/dL (ref 3.6–5.1)
Alkaline phosphatase (APISO): 111 U/L (ref 37–153)
BUN: 10 mg/dL (ref 7–25)
CO2: 24 mmol/L (ref 20–32)
Calcium: 9.7 mg/dL (ref 8.6–10.4)
Chloride: 101 mmol/L (ref 98–110)
Creat: 0.54 mg/dL (ref 0.50–1.03)
Globulin: 3.2 g/dL (ref 1.9–3.7)
Glucose, Bld: 92 mg/dL (ref 65–99)
Potassium: 4.1 mmol/L (ref 3.5–5.3)
Sodium: 136 mmol/L (ref 135–146)
Total Bilirubin: 0.3 mg/dL (ref 0.2–1.2)
Total Protein: 8 g/dL (ref 6.1–8.1)
eGFR: 110 mL/min/1.73m2

## 2024-10-24 LAB — QUANTIFERON-TB GOLD PLUS
Mitogen-NIL: 6.47 [IU]/mL
NIL: 0.09 [IU]/mL
QuantiFERON-TB Gold Plus: NEGATIVE
TB1-NIL: <0 [IU]/mL
TB2-NIL: <0 [IU]/mL

## 2024-10-24 LAB — SEDIMENTATION RATE: Sed Rate: 34 mm/h — ABNORMAL HIGH (ref 0–30)

## 2024-10-30 ENCOUNTER — Other Ambulatory Visit: Payer: Self-pay

## 2024-10-31 ENCOUNTER — Other Ambulatory Visit: Payer: Self-pay

## 2024-10-31 NOTE — Progress Notes (Signed)
 Per visit on 1/5, patient not controlled on Humira  and looking at changing to Actemra. Dis-enrolled

## 2024-11-01 ENCOUNTER — Encounter: Payer: Self-pay | Admitting: Family

## 2024-11-01 ENCOUNTER — Ambulatory Visit: Payer: Self-pay | Admitting: Family

## 2024-11-01 ENCOUNTER — Other Ambulatory Visit (HOSPITAL_COMMUNITY)
Admission: RE | Admit: 2024-11-01 | Discharge: 2024-11-01 | Disposition: A | Source: Ambulatory Visit | Attending: Family | Admitting: Family

## 2024-11-01 VITALS — BP 131/81 | HR 99 | Temp 97.4°F | Ht 66.25 in | Wt 122.2 lb

## 2024-11-01 DIAGNOSIS — Z79899 Other long term (current) drug therapy: Secondary | ICD-10-CM

## 2024-11-01 DIAGNOSIS — R9389 Abnormal findings on diagnostic imaging of other specified body structures: Secondary | ICD-10-CM | POA: Diagnosis not present

## 2024-11-01 DIAGNOSIS — Z720 Tobacco use: Secondary | ICD-10-CM

## 2024-11-01 DIAGNOSIS — F331 Major depressive disorder, recurrent, moderate: Secondary | ICD-10-CM

## 2024-11-01 DIAGNOSIS — E559 Vitamin D deficiency, unspecified: Secondary | ICD-10-CM

## 2024-11-01 DIAGNOSIS — Z1211 Encounter for screening for malignant neoplasm of colon: Secondary | ICD-10-CM

## 2024-11-01 DIAGNOSIS — E039 Hypothyroidism, unspecified: Secondary | ICD-10-CM

## 2024-11-01 DIAGNOSIS — Z0000 Encounter for general adult medical examination without abnormal findings: Secondary | ICD-10-CM | POA: Diagnosis present

## 2024-11-01 DIAGNOSIS — Z01411 Encounter for gynecological examination (general) (routine) with abnormal findings: Secondary | ICD-10-CM | POA: Diagnosis present

## 2024-11-01 DIAGNOSIS — F411 Generalized anxiety disorder: Secondary | ICD-10-CM

## 2024-11-01 DIAGNOSIS — Z1159 Encounter for screening for other viral diseases: Secondary | ICD-10-CM

## 2024-11-01 DIAGNOSIS — Z23 Encounter for immunization: Secondary | ICD-10-CM | POA: Diagnosis not present

## 2024-11-01 NOTE — Patient Instructions (Signed)

## 2024-11-01 NOTE — Progress Notes (Signed)
 "  Subjective:    Patient ID: Michelle Ortega, female    DOB: 07-Apr-1971, 54 y.o.   MRN: [id number]  Chief Complaint  Patient presents with   Annual Exam   Pt presents to the office today for CPE  with pap and chronic follow up.    She is followed by Rheumatologists for  RA every 3 months and currently taking Humira  and methotrexate .   Her CT chest 09/05/23 showed, 1. Multiple irregular patchy opacities in both upper lobes. The left upper lobe opacities are similar to the previous shoulder CT. The right upper lobe opacities were not imaged on the prior shoulder CT. Findings are most compatible with sequela from previous granulomatous disease with areas of scarring. However, the densities in the right upper lobe were not imaged on the previous examination and consider 6-12 month follow-up chest CT to ensure stability. 2. No acute lung abnormality. 3. Sequela from previous granulomatous disease in the chest and upper abdomen.  Will order repeat CT today.    She is followed by Cardiologists annually for A Fib and  her sister died of a MI at the age of 66 years old.  Thyroid  Problem Presents for follow-up visit. Symptoms include anxiety, constipation, dry skin and fatigue. Patient reports no hoarse voice. The symptoms have been stable.  Arthritis Presents for follow-up visit. She complains of pain and stiffness. Affected locations include the left MCP and right MCP. Her pain is at a severity of 5/10. Associated symptoms include fatigue.  Anxiety Presents for follow-up visit. Symptoms include excessive worry, nervous/anxious behavior and restlessness. Symptoms occur occasionally. The severity of symptoms is mild.    Depression        This is a chronic problem.  The current episode started more than 1 year ago.   The problem occurs intermittently.  Associated symptoms include fatigue, restlessness and sad.  Associated symptoms include no helplessness and no hopelessness.  Past treatments  include nothing.  Past medical history includes thyroid  problem and anxiety.   Gynecologic Exam The patient's pertinent negatives include no genital itching or genital odor. This is a chronic problem. Associated symptoms include constipation.      Review of Systems  Constitutional:  Positive for fatigue.  HENT:  Negative for hoarse voice.   Gastrointestinal:  Positive for constipation.  Musculoskeletal:  Positive for stiffness.  Psychiatric/Behavioral:  The patient is nervous/anxious.   All other systems reviewed and are negative.  Family History  Problem Relation Age of Onset   Hypothyroidism Mother    Hypertension Mother    Diabetes Father    Heart disease Father    Kidney disease Father    Diabetes Sister 41       MI   Heart attack Sister    Diabetes Sister    Diabetes Brother    Depression Daughter    Healthy Son    Social History   Socioeconomic History   Marital status: Divorced    Spouse name: Not on file   Number of children: Not on file   Years of education: Not on file   Highest education level: Associate degree: occupational, scientist, product/process development, or vocational program  Occupational History   Not on file  Tobacco Use   Smoking status: Every Day    Current packs/day: 1.00    Average packs/day: 1 pack/day for 37.2 years (37.2 ttl pk-yrs)    Types: Cigarettes    Start date: 06/06/1988    Passive exposure: Current   Smokeless tobacco:  Never  Vaping Use   Vaping status: Never Used  Substance and Sexual Activity   Alcohol use: Yes    Comment: rare   Drug use: Not Currently    Types: Marijuana   Sexual activity: Not on file  Other Topics Concern   Not on file  Social History Narrative   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    Social Drivers of Health   Tobacco Use: High Risk (11/01/2024)   Patient History    Smoking Tobacco Use: Every Day    Smokeless Tobacco Use: Never    Passive Exposure: Current  Financial Resource Strain: High Risk (10/31/2024)    Overall Financial Resource Strain (CARDIA)    Difficulty of Paying Living Expenses: Very hard  Food Insecurity: Food Insecurity Present (10/31/2024)   Epic    Worried About Programme Researcher, Broadcasting/film/video in the Last Year: Sometimes true    Ran Out of Food in the Last Year: Sometimes true  Transportation Needs: No Transportation Needs (10/31/2024)   Epic    Lack of Transportation (Medical): No    Lack of Transportation (Non-Medical): No  Physical Activity: Unknown (10/31/2024)   Exercise Vital Sign    Days of Exercise per Week: Patient declined    Minutes of Exercise per Session: Not on file  Stress: Patient Declined (10/31/2024)   [employer] of Occupational Health - Occupational Stress Questionnaire    Feeling of Stress: Patient declined  Social Connections: Unknown (10/31/2024)   Social Connection and Isolation Panel    Frequency of Communication with Friends and Family: Patient declined    Frequency of Social Gatherings with Friends and Family: Patient declined    Attends Religious Services: Patient declined    Active Member of Clubs or Organizations: Patient declined    Attends Banker Meetings: Not on file    Marital Status: Divorced  Depression (PHQ2-9): Medium Risk (11/01/2024)   Depression (PHQ2-9)    PHQ-2 Score: 8  Alcohol Screen: Low Risk (10/31/2024)   Alcohol Screen    Last Alcohol Screening Score (AUDIT): 1  Housing: High Risk (10/31/2024)   Epic    Unable to Pay for Housing in the Last Year: Yes    Number of Times Moved in the Last Year: 0    Homeless in the Last Year: No  Utilities: Not on file  Health Literacy: Not on file        Objective:   Physical Exam Vitals reviewed.  Constitutional:      General: She is not in acute distress.    Appearance: She is well-developed.  HENT:     Head: Normocephalic and atraumatic.     Right Ear: Tympanic membrane normal.     Left Ear: Tympanic membrane normal.  Eyes:     Pupils: Pupils are equal, round, and  reactive to light.  Neck:     Thyroid : No thyromegaly.  Cardiovascular:     Rate and Rhythm: Normal rate and regular rhythm.     Heart sounds: Normal heart sounds. No murmur heard. Pulmonary:     Effort: Pulmonary effort is normal. No respiratory distress.     Breath sounds: Normal breath sounds. No wheezing.  Chest:  Breasts:    Right: No swelling, bleeding, inverted nipple, mass, nipple discharge, skin change or tenderness.     Left: No swelling, bleeding, inverted nipple, mass, nipple discharge, skin change or tenderness.  Abdominal:     General: Bowel sounds are normal. There is no distension.  Palpations: Abdomen is soft.     Tenderness: There is no abdominal tenderness.  Genitourinary:    Comments: Bimanual exam- no adnexal masses or tenderness, ovaries nonpalpable   Cervix parous and pink- No discharge  Musculoskeletal:        General: No tenderness. Normal range of motion.     Cervical back: Normal range of motion and neck supple.  Skin:    General: Skin is warm and dry.  Neurological:     Mental Status: She is alert and oriented to person, place, and time.     Cranial Nerves: No cranial nerve deficit.     Deep Tendon Reflexes: Reflexes are normal and symmetric.  Psychiatric:        Behavior: Behavior normal.        Thought Content: Thought content normal.        Judgment: Judgment normal.       BP 131/81   Pulse 99   Temp (!) 97.4 F (36.3 C) (Temporal)   Ht 5' 6.25 (1.683 m)   Wt 122 lb 3.2 oz (55.4 kg)   SpO2 99%   BMI 19.58 kg/m      Assessment & Plan:  KYMORAH KORF comes in today with chief complaint of Annual Exam   Diagnosis and orders addressed:  1. Annual physical exam (Primary) - Ambulatory Referral for Lung Cancer Screening [REF832] - Ambulatory referral to Gastroenterology - CT Chest Wo Contrast; Future - Cytology - PAP([facility]) - Lipid panel - Hepatitis B surface antibody,quantitative - TSH  2. Encounter for  gynecological examination with abnormal finding - Cytology - PAP()  3. Abnormal CT of the chest  - Ambulatory Referral for Lung Cancer Screening [REF832] - CT Chest Wo Contrast; Future  4. Hypothyroidism, unspecified type - TSH  5. Vitamin D  deficiency - VITAMIN D  25 Hydroxy (Vit-D Deficiency, Fractures)  6. Moderate episode of recurrent major depressive disorder (HCC)  7. GAD (generalized anxiety disorder)  8. Tobacco abuse - Ambulatory Referral for Lung Cancer Screening [REF832] - CT Chest Wo Contrast; Future  9. High risk medication use  10. Colon cancer screening - Ambulatory referral to Gastroenterology  11. Need for hepatitis C screening test - Hepatitis B surface antibody,quantitative    Labs pending and labs reviewed  Continue current medications and specialists  Health Maintenance reviewed Diet and exercise encouraged  Follow up plan: 6 months    Bari Learn, FNP   "

## 2024-11-02 ENCOUNTER — Other Ambulatory Visit (HOSPITAL_COMMUNITY): Payer: Self-pay

## 2024-11-02 ENCOUNTER — Ambulatory Visit: Payer: Self-pay | Admitting: Family

## 2024-11-02 ENCOUNTER — Telehealth: Payer: Self-pay | Admitting: Family Medicine

## 2024-11-02 DIAGNOSIS — R87612 Low grade squamous intraepithelial lesion on cytologic smear of cervix (LGSIL): Secondary | ICD-10-CM

## 2024-11-02 LAB — LIPID PANEL
Chol/HDL Ratio: 3.4 ratio (ref 0.0–4.4)
Cholesterol, Total: 215 mg/dL — ABNORMAL HIGH (ref 100–199)
HDL: 63 mg/dL
LDL Chol Calc (NIH): 139 mg/dL — ABNORMAL HIGH (ref 0–99)
Triglycerides: 73 mg/dL (ref 0–149)
VLDL Cholesterol Cal: 13 mg/dL (ref 5–40)

## 2024-11-02 LAB — HEPATITIS B SURFACE ANTIBODY, QUANTITATIVE: Hepatitis B Surf Ab Quant: <3.5 m[IU]/mL — ABNORMAL LOW

## 2024-11-02 LAB — TSH: TSH: 8.72 u[IU]/mL — ABNORMAL HIGH (ref 0.450–4.500)

## 2024-11-02 LAB — VITAMIN D 25 HYDROXY (VIT D DEFICIENCY, FRACTURES): Vit D, 25-Hydroxy: 28.6 ng/mL — ABNORMAL LOW (ref 30.0–100.0)

## 2024-11-02 MED ORDER — VITAMIN D (ERGOCALCIFEROL) 1.25 MG (50000 UNIT) PO CAPS: 50000.0000 [IU] | ORAL_CAPSULE | ORAL | 1 refills | Status: AC

## 2024-11-02 MED ORDER — LEVOTHYROXINE SODIUM 125 MCG PO TABS: 125.0000 ug | ORAL_TABLET | Freq: Every day | ORAL | 3 refills | Status: AC

## 2024-11-02 NOTE — Progress Notes (Signed)
 Patient returned call - lab results given - patient understood

## 2024-11-02 NOTE — Telephone Encounter (Signed)
 Called patient left message refer to labs.  Copied from CRM [id number]. Topic: Clinical - Lab/Test Results >> Nov 02, 2024  4:01 PM Michelle Ortega wrote: Reason for CRM: Patient returned a call for her labs and she didn't want to speak with me, she wanted to speak to Michelle Ortega which is the person who left her a message about her labs. Contacted the clinic got [facility] and she stated Michelle was on the line and would call her back. Was told to send a message.   Could you assist and call the patient back at [phone]

## 2024-11-05 ENCOUNTER — Other Ambulatory Visit: Payer: Self-pay | Admitting: *Deleted

## 2024-11-05 DIAGNOSIS — G8929 Other chronic pain: Secondary | ICD-10-CM

## 2024-11-05 DIAGNOSIS — M79641 Pain in right hand: Secondary | ICD-10-CM

## 2024-11-05 NOTE — Progress Notes (Signed)
 Referral ordered

## 2024-11-07 LAB — CYTOLOGY - PAP
Comment: NEGATIVE
High risk HPV: POSITIVE — AB

## 2024-11-12 ENCOUNTER — Other Ambulatory Visit: Payer: Self-pay

## 2024-11-21 ENCOUNTER — Ambulatory Visit (HOSPITAL_COMMUNITY): Admission: RE | Admit: 2024-11-21 | Source: Ambulatory Visit

## 2024-11-29 ENCOUNTER — Ambulatory Visit (HOSPITAL_COMMUNITY)

## 2024-12-25 ENCOUNTER — Encounter: Admitting: Dermatology

## 2025-01-21 ENCOUNTER — Ambulatory Visit: Admitting: Internal Medicine

## 2025-05-02 ENCOUNTER — Ambulatory Visit: Admitting: Family
